# Patient Record
Sex: Male | Born: 1965 | Race: White | Hispanic: No | Marital: Married | Smoking: Never smoker
Health system: Southern US, Community
[De-identification: ages and names within clinical notes are randomized; demographics above are authoritative.]

## PROBLEM LIST (undated history)

## (undated) DIAGNOSIS — J302 Other seasonal allergic rhinitis: Secondary | ICD-10-CM

## (undated) HISTORY — PX: SPLENECTOMY: SUR1306

## (undated) HISTORY — PX: ABDOMINAL SURGERY: SHX537

---

## 2016-03-03 ENCOUNTER — Encounter (INDEPENDENT_AMBULATORY_CARE_PROVIDER_SITE_OTHER): Payer: Self-pay | Admitting: *Deleted

## 2016-03-25 ENCOUNTER — Other Ambulatory Visit (INDEPENDENT_AMBULATORY_CARE_PROVIDER_SITE_OTHER): Payer: Self-pay | Admitting: *Deleted

## 2016-03-25 ENCOUNTER — Encounter (INDEPENDENT_AMBULATORY_CARE_PROVIDER_SITE_OTHER): Payer: Self-pay | Admitting: *Deleted

## 2016-03-25 DIAGNOSIS — Z1211 Encounter for screening for malignant neoplasm of colon: Secondary | ICD-10-CM

## 2016-06-03 ENCOUNTER — Telehealth (INDEPENDENT_AMBULATORY_CARE_PROVIDER_SITE_OTHER): Payer: Self-pay | Admitting: *Deleted

## 2016-06-03 ENCOUNTER — Encounter (INDEPENDENT_AMBULATORY_CARE_PROVIDER_SITE_OTHER): Payer: Self-pay | Admitting: *Deleted

## 2016-06-03 NOTE — Telephone Encounter (Signed)
Patient needs trilyte 

## 2016-06-07 MED ORDER — PEG 3350-KCL-NA BICARB-NACL 420 G PO SOLR: 4000.0000 mL | Freq: Once | ORAL | 0 refills | Status: AC

## 2016-06-20 ENCOUNTER — Telehealth (INDEPENDENT_AMBULATORY_CARE_PROVIDER_SITE_OTHER): Payer: Self-pay | Admitting: *Deleted

## 2016-06-20 NOTE — Telephone Encounter (Signed)
Referring MD/PCP: burdine   Procedure: tcs  Reason/Indication:  screening  Has patient had this procedure before?  no  If so, when, by whom and where?    Is there a family history of colon cancer?  no  Who?  What age when diagnosed?    Is patient diabetic?   no      Does patient have prosthetic heart valve or mechanical valve?  no  Do you have a pacemaker?  no  Has patient ever had endocarditis? no  Has patient had joint replacement within last 12 months?  no  Does patient tend to be constipated or take laxatives? no  Does patient have a history of alcohol/drug use?  no  Is patient on Coumadin, Plavix and/or Aspirin? yes  Medications: asa 81 mg daily  Allergies: nkda  Medication Adjustment: asa 2 days  Procedure date & time: 07/13/16 at 730

## 2016-06-22 NOTE — Telephone Encounter (Signed)
agree

## 2016-07-13 ENCOUNTER — Encounter (HOSPITAL_COMMUNITY): Payer: Self-pay | Admitting: *Deleted

## 2016-07-13 ENCOUNTER — Encounter (HOSPITAL_COMMUNITY)
Admission: RE | Disposition: A | Discharge status: Home / Self Care | Payer: Self-pay | Source: Ambulatory Visit | Attending: Internal Medicine

## 2016-07-13 ENCOUNTER — Ambulatory Visit (HOSPITAL_COMMUNITY)
Admission: RE | Admit: 2016-07-13 | Discharge: 2016-07-13 | Disposition: A | Discharge status: Home / Self Care | Payer: 59 | Source: Ambulatory Visit | Attending: Internal Medicine | Admitting: Internal Medicine

## 2016-07-13 DIAGNOSIS — Z7982 Long term (current) use of aspirin: Secondary | ICD-10-CM | POA: Diagnosis not present

## 2016-07-13 DIAGNOSIS — K648 Other hemorrhoids: Secondary | ICD-10-CM | POA: Insufficient documentation

## 2016-07-13 DIAGNOSIS — Z7951 Long term (current) use of inhaled steroids: Secondary | ICD-10-CM | POA: Diagnosis not present

## 2016-07-13 DIAGNOSIS — J302 Other seasonal allergic rhinitis: Secondary | ICD-10-CM | POA: Insufficient documentation

## 2016-07-13 DIAGNOSIS — Z1211 Encounter for screening for malignant neoplasm of colon: Secondary | ICD-10-CM

## 2016-07-13 HISTORY — DX: Other seasonal allergic rhinitis: J30.2

## 2016-07-13 HISTORY — PX: COLONOSCOPY: SHX5424

## 2016-07-13 SURGERY — COLONOSCOPY
Anesthesia: Moderate Sedation

## 2016-07-13 MED ORDER — MIDAZOLAM HCL 5 MG/5ML IJ SOLN
INTRAMUSCULAR | Status: DC | PRN
  Administered 2016-07-13 (×4): 2 mg via INTRAVENOUS

## 2016-07-13 MED ORDER — SODIUM CHLORIDE 0.9 % IV SOLN
INTRAVENOUS | Status: DC
  Administered 2016-07-13: 08:00:00 via INTRAVENOUS

## 2016-07-13 MED ORDER — MEPERIDINE HCL 50 MG/ML IJ SOLN
INTRAMUSCULAR | Status: DC | PRN
  Administered 2016-07-13 (×2): 25 mg via INTRAVENOUS

## 2016-07-13 MED ORDER — MEPERIDINE HCL 50 MG/ML IJ SOLN
INTRAMUSCULAR | Status: AC
  Filled 2016-07-13: qty 1

## 2016-07-13 MED ORDER — MIDAZOLAM HCL 5 MG/5ML IJ SOLN
INTRAMUSCULAR | Status: AC
  Filled 2016-07-13: qty 10

## 2016-07-13 MED ORDER — SIMETHICONE 40 MG/0.6ML PO SUSP
ORAL | Status: AC
  Filled 2016-07-13: qty 0.6

## 2016-07-13 NOTE — Op Note (Signed)
[facility] Patient Name: Lucas Hill Procedure Date: 07/13/2016 8:46 AM MRN: [id number] Date of Birth: 08/04/1966 Attending MD: Lionel December , MD CSN: [id number] Age: 50 Admit Type: Outpatient Procedure:                Colonoscopy Indications:              Screening for colorectal malignant neoplasm Providers:                Lionel December, MD, Nena Polio, RN, Burke Keels,                            Technician Referring MD:             Juliette Alcide, MD Medicines:                Meperidine 50 mg IV, Midazolam 8 mg IV Complications:            No immediate complications. Estimated Blood Loss:     Estimated blood loss: none. Procedure:                Pre-Anesthesia Assessment:                           - Prior to the procedure, a History and Physical                            was performed, and patient medications and                            allergies were reviewed. The patient's tolerance of                            previous anesthesia was also reviewed. The risks                            and benefits of the procedure and the sedation                            options and risks were discussed with the patient.                            All questions were answered, and informed consent                            was obtained. Prior Anticoagulants: The patient                            last took aspirin 2 days prior to the procedure.                            ASA Grade Assessment: I - A normal, healthy                            patient. After reviewing the risks and benefits,  the patient was deemed in satisfactory condition to                            undergo the procedure.                           After obtaining informed consent, the colonoscope                            was passed under direct vision. Throughout the                            procedure, the patient's blood pressure, pulse, and                            oxygen  saturations were monitored continuously. The                            was introduced through the anus and advanced to the                            the cecum, identified by appendiceal orifice and                            ileocecal valve. The colonoscopy was performed                            without difficulty. The patient tolerated the                            procedure well. The quality of the bowel                            preparation was good. The ileocecal valve,                            appendiceal orifice, and rectum were photographed. Scope In: 9:06:22 AM Scope Out: 9:25:21 AM Scope Withdrawal Time: 0 hours 9 minutes 50 seconds  Total Procedure Duration: 0 hours 18 minutes 59 seconds  Findings:      The colon (entire examined portion) appeared normal.      Internal hemorrhoids were found during retroflexion. The hemorrhoids       were small. Impression:               - The entire examined colon is normal.                           - Internal hemorrhoids.                           - No specimens collected. Moderate Sedation:      Moderate (conscious) sedation was administered by the endoscopy nurse       and supervised by the endoscopist. The following parameters were       monitored: oxygen saturation, heart rate, blood pressure, CO2  capnography and response to care. Total physician intraservice time was       25 minutes. Recommendation:           - Patient has a contact number available for                            emergencies. The signs and symptoms of potential                            delayed complications were discussed with the                            patient. Return to normal activities tomorrow.                            Written discharge instructions were provided to the                            patient.                           - Resume previous diet today.                           - Continue present medications.                            - Repeat colonoscopy in 10 years for screening                            purposes. Procedure Code(s):        --- Professional ---                           [phone], Colonoscopy, flexible; diagnostic, including                            collection of specimen(s) by brushing or washing,                            when performed (separate procedure)                           [id number], Moderate sedation services provided by the                            same physician or other qualified health care                            professional performing the diagnostic or                            therapeutic service that the sedation supports,                            requiring the presence of an independent trained  observer to assist in the monitoring of the                            patient's level of consciousness and physiological                            status; initial 15 minutes of intraservice time,                            patient age 73 years or older                           [phone], Moderate sedation services; each additional                            15 minutes intraservice time Diagnosis Code(s):        --- Professional ---                           Z12.11, Encounter for screening for malignant                            neoplasm of colon                           K64.8, Other hemorrhoids CPT copyright 2016 [employer]. All rights reserved. The codes documented in this report are preliminary and upon coder review may  be revised to meet current compliance requirements. Lionel December, MD Lionel December, MD 07/13/2016 9:32:40 AM This report has been signed electronically. Number of Addenda: 0

## 2016-07-13 NOTE — H&P (Signed)
Lucas CapraSteven Hill is an 50 y.o. male.   Chief Complaint: Patient is here for colonoscopy. HPI: Patient is 50 year old Caucasian male who is in for screening colonoscopy. He denies abdominal pain change in bowel habits or rectal bleeding. Family history is negative for CRC.  Past Medical History:  Diagnosis Date  . Seasonal allergies     Past Surgical History:  Procedure Laterality Date  . 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 SPLENECTOMY      History reviewed. No pertinent family history. Social History:  reports that he has never smoked. He has never used smokeless tobacco. He reports that he does not drink alcohol or use drugs.  Allergies: No Known Allergies  Medications Prior to Admission  Medication Sig Dispense Refill  . aspirin 81 MG chewable tablet Chew 81 mg by mouth daily.    . fluticasone (FLONASE) 50 MCG/ACT nasal spray Place 1 spray into both nostrils daily.      No results found for this or any previous visit (from the past 48 hour(s)). No results found.  ROS  Blood pressure 131/80, pulse (!) 56, temperature 97.6 F (36.4 C), temperature source Oral, resp. rate 16, height 6\' 1"  (1.854 m), weight 209 lb (94.8 kg), SpO2 100 %. Physical Exam  Constitutional: He appears well-developed and well-nourished.  HENT:  Mouth/Throat: Oropharynx is clear and moist.  Eyes: Conjunctivae are normal. No scleral icterus.  Neck: No thyromegaly present.  Cardiovascular: Normal rate, regular rhythm and normal heart sounds.   No murmur heard. Respiratory: Effort normal and breath sounds normal.  GI:  Abdomen is symmetrical with scar left upper quadrant as well as Cottonoid quadrant. Abdomen is soft and non-tender without organomegaly or masses.  Lymphadenopathy:    He has no cervical adenopathy.  Neurological: He is alert.  Skin: Skin is warm and dry.     Assessment/Plan Average risk screening colonoscopy.  Lucas DecemberNajeeb Rehman, MD 07/13/2016, 8:55 AM

## 2016-07-13 NOTE — Discharge Instructions (Signed)
Resume usual medications and diet. ° No driving for 24 hours. ° Next screening exam in 10 years. ° °Colonoscopy, Care After °Refer to this sheet in the next few weeks. These instructions provide you with information on caring for yourself after your procedure. Your health care provider may also give you more specific instructions. Your treatment has been planned according to current medical practices, but problems sometimes occur. Call your health care provider if you have any problems or questions after your procedure. °WHAT TO EXPECT AFTER THE PROCEDURE  °After your procedure, it is typical to have the following: °· A small amount of blood in your stool. °· Moderate amounts of gas and mild abdominal cramping or bloating. °HOME CARE INSTRUCTIONS °· Do not drive, operate machinery, or sign important documents for 24 hours. °· You may shower and resume your regular physical activities, but move at a slower pace for the first 24 hours. °· Take frequent rest periods for the first 24 hours. °· Walk around or put a warm pack on your abdomen to help reduce abdominal cramping and bloating. °· Drink enough fluids to keep your urine clear or pale yellow. °· You may resume your normal diet as instructed by your health care provider. Avoid heavy or fried foods that are hard to digest. °· Avoid drinking alcohol for 24 hours or as instructed by your health care provider. °· Only take over-the-counter or prescription medicines as directed by your health care provider. °· If a tissue sample (biopsy) was taken during your procedure: °¨ Do not take aspirin or blood thinners for 7 days, or as instructed by your health care provider. °¨ Do not drink alcohol for 7 days, or as instructed by your health care provider. °¨ Eat soft foods for the first 24 hours. °SEEK MEDICAL CARE IF: °You have persistent spotting of blood in your stool 2-3 days after the procedure. °SEEK IMMEDIATE MEDICAL CARE IF: °· You have more than a small spotting of  blood in your stool. °· You pass large blood clots in your stool. °· Your abdomen is swollen (distended). °· You have nausea or vomiting. °· You have a fever. °· You have increasing abdominal pain that is not relieved with medicine. °  °This information is not intended to replace advice given to you by your health care provider. Make sure you discuss any questions you have with your health care provider. °  °Document Released: 06/07/2004 Document Revised: 08/14/2013 Document Reviewed: 07/01/2013 °Elsevier Interactive Patient Education ©2016 [employer]. ° °

## 2016-07-18 ENCOUNTER — Encounter (HOSPITAL_COMMUNITY): Payer: Self-pay | Admitting: Internal Medicine

## 2019-08-27 ENCOUNTER — Other Ambulatory Visit: Payer: Self-pay

## 2019-08-27 DIAGNOSIS — Z20822 Contact with and (suspected) exposure to covid-19: Secondary | ICD-10-CM

## 2019-08-29 LAB — NOVEL CORONAVIRUS, NAA: SARS-CoV-2, NAA: NOT DETECTED

## 2020-02-06 ENCOUNTER — Ambulatory Visit: Payer: 59 | Attending: Internal Medicine

## 2020-02-06 DIAGNOSIS — Z23 Encounter for immunization: Secondary | ICD-10-CM

## 2020-02-06 NOTE — Progress Notes (Signed)
   Covid-19 Vaccination Clinic  Name:  Lucas Hill    MRN: [id number] DOB: 04/24/1966  02/06/2020  [name] was observed post Covid-19 immunization for 15 minutes without incident. He was provided with Vaccine Information Sheet and instruction to access the V-Safe system.   [name] was instructed to call 911 with any severe reactions post vaccine: Marland Kitchen Difficulty breathing  . Swelling of face and throat  . A fast heartbeat  . A bad rash all over body  . Dizziness and weakness   Immunizations Administered    Name Date Dose VIS Date Route   Pfizer COVID-19 Vaccine 02/06/2020  9:28 AM 0.3 mL 10/18/2019 Intramuscular   Manufacturer: [employer], [employer]   Lot: [id number]   NDC: [address]-3

## 2020-03-02 ENCOUNTER — Ambulatory Visit: Payer: 59 | Attending: Internal Medicine

## 2020-03-02 DIAGNOSIS — Z23 Encounter for immunization: Secondary | ICD-10-CM

## 2020-03-02 NOTE — Progress Notes (Signed)
   Covid-19 Vaccination Clinic  Name:  Lucas Hill    MRN: [id number] DOB: Dec 25, 1965  03/02/2020  Lucas Hill was observed post Covid-19 immunization for 15 minutes without incident. He was provided with Vaccine Information Sheet and instruction to access the V-Safe system.   Lucas Hill was instructed to call 911 with any severe reactions post vaccine: Marland Kitchen Difficulty breathing  . Swelling of face and throat  . A fast heartbeat  . A bad rash all over body  . Dizziness and weakness   Immunizations Administered    Name Date Dose VIS Date Route   Pfizer COVID-19 Vaccine 03/02/2020  8:56 AM 0.3 mL 01/01/2019 Intramuscular   Manufacturer: [employer], [employer]   Lot: [id number]   NDC: [address]-3

## 2020-11-24 ENCOUNTER — Other Ambulatory Visit: Payer: Self-pay

## 2020-11-24 ENCOUNTER — Ambulatory Visit (INDEPENDENT_AMBULATORY_CARE_PROVIDER_SITE_OTHER): Payer: 59 | Admitting: Allergy

## 2020-11-24 ENCOUNTER — Encounter: Payer: Self-pay | Admitting: Allergy

## 2020-11-24 VITALS — BP 122/82 | HR 67 | Temp 97.2°F | Resp 18 | Ht 72.44 in | Wt 200.0 lb

## 2020-11-24 DIAGNOSIS — J3089 Other allergic rhinitis: Secondary | ICD-10-CM | POA: Diagnosis not present

## 2020-11-24 DIAGNOSIS — H1013 Acute atopic conjunctivitis, bilateral: Secondary | ICD-10-CM | POA: Insufficient documentation

## 2020-11-24 NOTE — Patient Instructions (Addendum)
Today's skin testing showed: Positive to grass, trees, weed, dust mites, mold, cat, dog, cockroach. Borderline to mouse and tobacco.   Environmental allergies  Start environmental control measures as below.  May use over the counter antihistamines such as Zyrtec (cetirizine), Claritin (loratadine), Allegra (fexofenadine), or Xyzal (levocetirizine) daily as needed.  Start using Flonase (fluticasone) nasal spray 1 spray per nostril twice a day daily for the next 2 months.  Start nasal saline lavage (i.e., NeilMed) daily.  Consider allergy injections - handout given.   Had a detailed discussion with patient/family that clinical history is suggestive of allergic rhinitis, and may benefit from allergy immunotherapy (AIT). Discussed in detail regarding the dosing, schedule, side effects (mild to moderate local allergic reaction and rarely systemic allergic reactions including anaphylaxis), and benefits (significant improvement in nasal symptoms, seasonal flares of asthma) of immunotherapy with the patient. There is significant time commitment involved with allergy shots, which includes weekly immunotherapy injections for first 9-12 months and then biweekly to monthly injections for 3-5 years.   Follow up in 2 months or sooner if needed in [place] with Dr. Dellis Anes.   Reducing Pollen Exposure . Pollen seasons: trees (spring), grass (summer) and ragweed/weeds (fall). Marland Kitchen Keep windows closed in your home and car to lower pollen exposure.  Lilian Kapur air conditioning in the bedroom and throughout the house if possible.  . Avoid going out in dry windy days - especially early morning. . Pollen counts are highest between 5 - 10 AM and on dry, hot and windy days.  . Save outside activities for late afternoon or after a heavy rain, when pollen levels are lower.  . Avoid mowing of grass if you have grass pollen allergy. Marland Kitchen Be aware that pollen can also be transported indoors on people and pets.  . Dry  your clothes in an automatic dryer rather than hanging them outside where they might collect pollen.  . Rinse hair and eyes before bedtime. Control of House Dust Mite Allergen . Dust mite allergens are a common trigger of allergy and asthma symptoms. While they can be found throughout the house, these microscopic creatures thrive in warm, humid environments such as bedding, upholstered furniture and carpeting. . Because so much time is spent in the bedroom, it is essential to reduce mite levels there.  . Encase pillows, mattresses, and box springs in special allergen-proof fabric covers or airtight, zippered plastic covers.  . Bedding should be washed weekly in hot water (130 F) and dried in a hot dryer. Allergen-proof covers are available for comforters and pillows that can't be regularly washed.  Reyes Ivan the allergy-proof covers every few months. Minimize clutter in the bedroom. Keep pets out of the bedroom.  Marland Kitchen Keep humidity less than 50% by using a dehumidifier or air conditioning. You can buy a humidity measuring device called a hygrometer to monitor this.  . If possible, replace carpets with hardwood, linoleum, or washable area rugs. If that's not possible, vacuum frequently with a vacuum that has a HEPA filter. . Remove all upholstered furniture and non-washable window drapes from the bedroom. . Remove all non-washable stuffed toys from the bedroom.  Wash stuffed toys weekly.  Pet Allergen Avoidance: . Contrary to popular opinion, there are no "hypoallergenic" breeds of dogs or cats. That is because people are not allergic to an animal's hair, but to an allergen found in the animal's saliva, dander (dead skin flakes) or urine. Pet allergy symptoms typically occur within minutes. For some people, symptoms can  build up and become most severe 8 to 12 hours after contact with the animal. People with severe allergies can experience reactions in public places if dander has been transported on the pet  owners' clothing. Marland Kitchen Keeping an animal outdoors is only a partial solution, since homes with pets in the yard still have higher concentrations of animal allergens. . Before getting a pet, ask your allergist to determine if you are allergic to animals. If your pet is already considered part of your family, try to minimize contact and keep the pet out of the bedroom and other rooms where you spend a great deal of time. . As with dust mites, vacuum carpets often or replace carpet with a hardwood floor, tile or linoleum. . High-efficiency particulate air (HEPA) cleaners can reduce allergen levels over time. . While dander and saliva are the source of cat and dog allergens, urine is the source of allergens from rabbits, hamsters, mice and [place] pigs; so ask a non-allergic family member to clean the animal's cage. . If you have a pet allergy, talk to your allergist about the potential for allergy immunotherapy (allergy shots). This strategy can often provide long-term relief. Mold Control . Mold and fungi can grow on a variety of surfaces provided certain temperature and moisture conditions exist.  . Outdoor molds grow on plants, decaying vegetation and soil. The major outdoor mold, Alternaria and Cladosporium, are found in very high numbers during hot and dry conditions. Generally, a late summer - fall peak is seen for common outdoor fungal spores. Rain will temporarily lower outdoor mold spore count, but counts rise rapidly when the rainy period ends. . The most important indoor molds are Aspergillus and Penicillium. Dark, humid and poorly ventilated basements are ideal sites for mold growth. The next most common sites of mold growth are the bathroom and the kitchen. Outdoor (Seasonal) Mold Control . Use air conditioning and keep windows closed. . Avoid exposure to decaying vegetation. Marland Kitchen Avoid leaf raking. . Avoid grain handling. . Consider wearing a face mask if working in moldy areas.  Indoor  (Perennial) Mold Control  . Maintain humidity below 50%. . Get rid of mold growth on hard surfaces with water, detergent and, if necessary, 5% bleach (do not mix with other cleaners). Then dry the area completely. If mold covers an area more than 10 square feet, consider hiring an indoor environmental professional. . For clothing, washing with soap and water is best. If moldy items cannot be cleaned and dried, throw them away. . Remove sources e.g. contaminated carpets. . Repair and seal leaking roofs or pipes. Using dehumidifiers in damp basements may be helpful, but empty the water and clean units regularly to prevent mildew from forming. All rooms, especially basements, bathrooms and kitchens, require ventilation and cleaning to deter mold and mildew growth. Avoid carpeting on concrete or damp floors, and storing items in damp areas. Cockroach Allergen Avoidance Cockroaches are often found in the homes of densely populated urban areas, schools or commercial buildings, but these creatures can lurk almost anywhere. This does not mean that you have a dirty house or living area. . Block all areas where roaches can enter the home. This includes crevices, wall cracks and windows.  . Cockroaches need water to survive, so fix and seal all leaky faucets and pipes. Have an exterminator go through the house when your family and pets are gone to eliminate any remaining roaches. Marland Kitchen Keep food in lidded containers and put pet food dishes away  after your pets are done eating. Vacuum and sweep the floor after meals, and take out garbage and recyclables. Use lidded garbage containers in the kitchen. Wash dishes immediately after use and clean under stoves, refrigerators or toasters where crumbs can accumulate. Wipe off the stove and other kitchen surfaces and cupboards regularly.  Buffered Isotonic Saline Irrigations:  Goal: . When you irrigate with the isotonic saline (salt water) it washes mucous and other debris  from your nose that could be contributing to your nasal symptoms.   Recipe: Marland Kitchen Obtain 1 quart jar that is clean . Fill with clean (bottled, boiled or distilled) water . Add 1-2 heaping teaspoons of salt without iodine o If the solution with 2 teaspoons of salt is too strong, adjust the amount down until better tolerated . Add 1 teaspoon of [employer] baking soda (pure bicarbonate) . Mix ingredients together and store at room temperature and discard after 1 week * Alternatively you can buy pre made salt packets for the NeilMed bottle or there          are other over the counter brands available  Instructions: . Warm  cup of the solution in the microwave if desired but be careful not to overheat as this will burn the inside of your nose . Stand over a sink (or do it while you shower) and squirt the solution into one side of your nose aiming towards the back of your head o Sometimes saying "coca cola" while irrigating can be helpful to prevent fluid from going down your throat  . The solution will travel to the back of your nose and then come out the other side . Perform this again on the other side . Try to do this twice a day . If you are using a nasal spray in addition to the irrigation, irrigate first and then use the topical nasal spray otherwise you will wash the nasal spray out of your noseZ

## 2020-11-24 NOTE — Progress Notes (Signed)
New Patient Note  RE: Lucas Hill MRN: [id number] DOB: May 14, 1966 Date of Office Visit: 11/24/2020  Referring provider: Juliette Alcide, MD Primary care provider: Juliette Alcide, MD  Chief Complaint: Sinus Problem  History of Present Illness: I had the pleasure of seeing Gordan Grell for initial evaluation at the [facility] on 11/24/2020. He is a 55 y.o. male, who is self-referred here for the evaluation of rhinitis.  He reports symptoms of nasal congestion, rhinorrhea, sneezing, occasional itchy/watery eyes. Symptoms have been going on for 10 years. The symptoms usually occur in Feb/March and possibly winter months. Other triggers include exposure to unknown. Anosmia: diminished sense of smell. Headache: no. He has used Flonase 2 sprays daily with fair improvement in symptoms. Tried Claritin with no benefit. Sinus infections: few times per year requiring antibiotics. Previous work up includes: no. Previous ENT evaluation: no. Previous sinus imaging: no. History of nasal polyps: no. Last eye exam: 1 year ago. History of reflux: sometimes.  Assessment and Plan: Cayde is a 55 y.o. male with: Other allergic rhinitis Mainly rhinitis symptoms for the past 10 years during Feb/March and winter months requiring antibiotics to clear up the symptoms. Tried Flonase with good benefit, Claritin not effective. No prior allergy/ENT evaluation.  Today's skin testing showed: Positive to grass, trees, weed, dust mites, mold, cat, dog, cockroach. Borderline to mouse and tobacco.   On physical exam significant left sided nasal turbinate hypertrophy with questionable polyp - denies symptoms.   Start environmental control measures as below.  May use over the counter antihistamines such as Zyrtec (cetirizine), Claritin (loratadine), Allegra (fexofenadine), or Xyzal (levocetirizine) daily as needed.  Start using Flonase (fluticasone) nasal spray 1 spray per nostril twice a day  daily for the next 2 months.  Start nasal saline lavage (i.e., NeilMed) daily.  If no improvement in physical exam/symptoms with above treatment, will refer to ENT next.   Consider allergy injections for long term control - handout given.   Had a detailed discussion with patient/family that clinical history is suggestive of allergic rhinitis, and may benefit from allergy immunotherapy (AIT). Discussed in detail regarding the dosing, schedule, side effects (mild to moderate local allergic reaction and rarely systemic allergic reactions including anaphylaxis), and benefits (significant improvement in nasal symptoms, seasonal flares of asthma) of immunotherapy with the patient. There is significant time commitment involved with allergy shots, which includes weekly immunotherapy injections for first 9-12 months and then biweekly to monthly injections for 3-5 years.  Allergic conjunctivitis of both eyes Minimal conjunctivitis symptoms.  See assessment and plan as above for allergic rhinitis.   Return in about 2 months (around 01/22/2021).  Other allergy screening: Asthma: no Food allergy: no Medication allergy: no Hymenoptera allergy: no Urticaria: no Eczema:no History of recurrent infections suggestive of immunodeficency: no  Diagnostics: Skin Testing: Environmental allergy panel. Positive to grass, trees, weed, dust mites, mold, cat, dog, cockroach. Borderline to mouse and tobacco.   Results discussed with patient/family.  Airborne Adult Perc - 11/24/20 1528    Time Antigen Placed 1528    Allergen Manufacturer Waynette Buttery    Location Back    Number of Test 59    1. Control-Buffer 50% Glycerol Negative    2. Control-Histamine 1 mg/ml 2+    3. Albumin saline Negative    4. Bahia Negative    5. French Southern Territories Negative    6. Johnson Negative    7. Kentucky Blue 4+    8. Meadow Fescue 3+  9. Perennial Rye 2+    10. Sweet Vernal 3+    11. Timothy 4+    12. Cocklebur Negative    13.  Burweed Marshelder Negative    14. Ragweed, short Negative    15. Ragweed, Giant Negative    16. Plantain,  English Negative    17. Lamb's Quarters Negative    18. Sheep Sorrell Negative    19. Rough Pigweed Negative    20. Marsh Elder, Rough Negative    21. Mugwort, Common Negative    22. Ash mix Negative    23. Birch mix Negative    24. Beech American 2+    25. Box, Elder Negative    26. Cedar, red Negative    27. Cottonwood, Guinea-[place]issau Negative    28. Elm mix Negative    29. Hickory 3+    30. Maple mix 2+    31. Oak, Guinea-[place] mix Negative    32. Pecan Pollen 2+    33. Pine mix Negative    34. Sycamore Guinea-Bissau --   +/-   35. Walnut, Black Pollen --   +/-   36. Alternaria alternata Negative    37. Cladosporium Herbarum Negative    38. Aspergillus mix Negative    39. Penicillium mix Negative    40. Bipolaris sorokiniana (Helminthosporium) Negative    41. Drechslera spicifera (Curvularia) Negative    42. Mucor plumbeus Negative    43. Fusarium moniliforme Negative    44. Aureobasidium pullulans (pullulara) Negative    45. Rhizopus oryzae Negative    46. Botrytis cinera Negative    47. Epicoccum nigrum Negative    48. Phoma betae Negative    49. Candida Albicans Negative    50. Trichophyton mentagrophytes Negative    51. Mite, D Farinae  5,000 AU/ml 2+    52. Mite, D Pteronyssinus  5,000 AU/ml 2+    53. Cat Hair 10,000 BAU/ml 2+    54.  Dog Epithelia Negative    55. Mixed Feathers Negative    56. Horse Epithelia Negative    57. Cockroach, German Negative    58. Mouse --   +/-   59. Tobacco Leaf --   +/-         Intradermal - 11/24/20 1558    Time Antigen Placed 0350    Allergen Manufacturer Waynette Buttery    Location Arm    Number of Test 11    Intradermal Select    Control Negative    [place] 2+    Johnson 2+    Ragweed mix Negative    Weed mix 2+    Mold 1 Negative    Mold 2 3+    Mold 3 2+    Mold 4 3+    Dog 3+    Cockroach 2+           Past Medical  History: Patient Active Problem List   Diagnosis Date Noted  . Other allergic rhinitis 11/24/2020  . Allergic conjunctivitis of both eyes 11/24/2020   Past Medical History:  Diagnosis Date  . Seasonal allergies    Past Surgical History: Past Surgical History:  Procedure Laterality Date  . 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 COLONOSCOPY N/A 07/13/2016   Procedure: COLONOSCOPY;  Surgeon: Malissa Hippo, MD;  Location: [facility];  Service: Endoscopy;  Laterality: N/A;  730  . SPLENECTOMY     Medication List:  Current Outpatient Medications  Medication  Sig Dispense Refill  . aspirin 81 MG chewable tablet Chew 81 mg by mouth daily.    . fluticasone (FLONASE) 50 MCG/ACT nasal spray Place 1 spray into both nostrils daily.     No current facility-administered medications for this visit.   Allergies: No Known Allergies Social History: Social History   Socioeconomic History  . Marital status: Single    Spouse name: Not on file  . Number of children: Not on file  . Years of education: Not on file  . Highest education level: Not on file  Occupational History  . Not on file  Tobacco Use  . Smoking status: Never Smoker  . Smokeless tobacco: Never Used  Substance and Sexual Activity  . Alcohol use: No  . Drug use: No  . Sexual activity: Not on file  Other Topics Concern  . Not on file  Social History Narrative  . Not on file   Social Determinants of Health   Financial Resource Strain: Not on file  Food Insecurity: Not on file  Transportation Needs: Not on file  Physical Activity: Not on file  Stress: Not on file  Social Connections: Not on file   Lives in a 55 year old house. 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 No pertinent family history. Problem                               Relation Asthma                                   No  Eczema                                No  Food allergy                          No  Allergic rhino conjunctivitis     No   Review of Systems  Constitutional: Negative for appetite change, chills, fever and unexpected weight change.  HENT: Negative for congestion and rhinorrhea.   Eyes: Negative for itching.  Respiratory: Negative for cough, chest tightness, shortness of breath and wheezing.   Cardiovascular: Negative for chest pain.  Gastrointestinal: Negative for abdominal pain.  Genitourinary: Negative for difficulty urinating.  Skin: Negative for rash.  Allergic/Immunologic: Positive for environmental allergies.  Neurological: Negative for headaches.   Objective: BP 122/82 (BP Location: Right Arm, Patient Position: Sitting, Cuff Size: Normal)   Pulse 67   Temp (!) 97.2 F (36.2 C) (Temporal)   Resp 18   Ht 6' 0.44" (1.84 m)   Wt 200 lb (90.7 kg)   SpO2 97%   BMI 26.80 kg/m  Body mass index is 26.8 kg/m. Physical Exam Vitals and nursing note reviewed.  Constitutional:      Appearance: Normal appearance. He is well-developed.  HENT:     Head: Normocephalic and atraumatic.     Right Ear: Tympanic membrane and external ear normal.     Left Ear: Tympanic membrane and external ear normal.     Nose: Congestion (left side nasal turbinate  hypertrophy, ? polyp) present.     Mouth/Throat:     Mouth: Mucous membranes are moist.     Pharynx: Oropharynx is clear.  Eyes:     Conjunctiva/sclera: Conjunctivae normal.  Cardiovascular:     Rate and Rhythm: Normal rate and regular rhythm.     Heart sounds: Normal heart sounds. No murmur heard. No friction rub. No gallop.   Pulmonary:     Effort: Pulmonary effort is normal.     Breath sounds: Normal breath sounds. No wheezing, rhonchi or rales.  Musculoskeletal:     Cervical back: Neck supple.   Skin:    General: Skin is warm.     Findings: No rash.  Neurological:     Mental Status: He is alert and oriented to person, place, and time.  Psychiatric:        Behavior: Behavior normal.    The plan was reviewed with the patient/family, and all questions/concerned were addressed.  It was my pleasure to see Mackinley today and participate in his care. Please feel free to contact me with any questions or concerns.  Sincerely,  Wyline Mood, DO Allergy & Immunology  [facility] office: [phone] [facility] office: [phone]

## 2020-11-24 NOTE — Assessment & Plan Note (Signed)
Minimal conjunctivitis symptoms.  See assessment and plan as above for allergic rhinitis.

## 2020-11-24 NOTE — Assessment & Plan Note (Addendum)
Mainly rhinitis symptoms for the past 10 years during Feb/March and winter months requiring antibiotics to clear up the symptoms. Tried Flonase with good benefit, Claritin not effective. No prior allergy/ENT evaluation.  Today's skin testing showed: Positive to grass, trees, weed, dust mites, mold, cat, dog, cockroach. Borderline to mouse and tobacco.   On physical exam significant left sided nasal turbinate hypertrophy with questionable polyp - denies symptoms.   Start environmental control measures as below.  May use over the counter antihistamines such as Zyrtec (cetirizine), Claritin (loratadine), Allegra (fexofenadine), or Xyzal (levocetirizine) daily as needed.  Start using Flonase (fluticasone) nasal spray 1 spray per nostril twice a day daily for the next 2 months.  Start nasal saline lavage (i.e., NeilMed) daily.  If no improvement in physical exam/symptoms with above treatment, will refer to ENT next.   Consider allergy injections for long term control - handout given.   Had a detailed discussion with patient/family that clinical history is suggestive of allergic rhinitis, and may benefit from allergy immunotherapy (AIT). Discussed in detail regarding the dosing, schedule, side effects (mild to moderate local allergic reaction and rarely systemic allergic reactions including anaphylaxis), and benefits (significant improvement in nasal symptoms, seasonal flares of asthma) of immunotherapy with the patient. There is significant time commitment involved with allergy shots, which includes weekly immunotherapy injections for first 9-12 months and then biweekly to monthly injections for 3-5 years.

## 2021-01-27 ENCOUNTER — Ambulatory Visit (INDEPENDENT_AMBULATORY_CARE_PROVIDER_SITE_OTHER): Payer: 59 | Admitting: Allergy & Immunology

## 2021-01-27 ENCOUNTER — Other Ambulatory Visit: Payer: Self-pay

## 2021-01-27 ENCOUNTER — Encounter: Payer: Self-pay | Admitting: Allergy & Immunology

## 2021-01-27 VITALS — BP 140/80 | HR 59 | Temp 98.3°F | Resp 14 | Ht 72.5 in | Wt 206.2 lb

## 2021-01-27 DIAGNOSIS — J3089 Other allergic rhinitis: Secondary | ICD-10-CM | POA: Diagnosis not present

## 2021-01-27 DIAGNOSIS — J302 Other seasonal allergic rhinitis: Secondary | ICD-10-CM | POA: Diagnosis not present

## 2021-01-27 NOTE — Patient Instructions (Addendum)
1. Seasonal and perennial allergic rhinitis - with possible left sided nasal polyp - Continue with Flonase two sprays per nostril up to twice daily. - Continue with an over the counter antihistamine (you can double this dose during bad days). - Continue with nasal saline rinses.  - Allergy shot info and consent signed today. - We will not mix it until you call us to let us know that it is ok. - Allergy shots "re-train" and "reset" the immune system to ignore environmental allergens and decrease the resulting immune response to those allergens (sneezing, itchy watery eyes, runny nose, nasal congestion, etc).    - Allergy shots improve symptoms in 75-85% of patients.  - This would decrease your need for medications and decrease your lifetime exposure to prednisone and antibiotics.   2. Return in about 6 months (around 07/30/2021).    Please inform us of any Emergency Department visits, hospitalizations, or changes in symptoms. Call us before going to the ED for breathing or allergy symptoms since we might be able to fit you in for a sick visit. Feel free to contact us anytime with any questions, problems, or concerns.  It was a pleasure to meet you today!  Websites that have reliable patient information: 1. American Academy of Asthma, Allergy, and Immunology: www.aaaai.org 2. Food Allergy Research and Education (FARE): foodallergy.org 3. Mothers of Asthmatics: http://www.asthmacommunitynetwork.org 4. American College of Allergy, Asthma, and Immunology: www.acaai.org   COVID-19 Vaccine Information can be found at: PodExchange.[email or web address] For questions related to vaccine distribution or appointments, please email [email or web address] or call [phone].   We realize that you might be concerned about having an allergic reaction to the COVID19 vaccines. To help with that concern, WE ARE OFFERING THE COVID19 VACCINES IN OUR OFFICE! Ask the  front desk for dates!     "Like" [place] on Facebook and Instagram for our latest updates!      A healthy democracy works best when [employer] participate! Make sure you are registered to vote! If you have moved or changed any of your contact information, you will need to get this updated before voting!  In some cases, you MAY be able to register to vote online: [email or web address]

## 2021-01-27 NOTE — Progress Notes (Signed)
FOLLOW UP  Date of Service/Encounter:  01/27/21   Assessment:   Perennial and seasonal allergic rhinitis (grasses, trees, weeds, molds, dust mite, dogs, cat, cockroach)   ? Left sided nasal polyp  Plan/Recommendations:   1. Seasonal and perennial allergic rhinitis - with possible left sided nasal polyp - Continue with Flonase two sprays per nostril up to twice daily. - Continue with an over the counter antihistamine (you can double this dose during bad days). - Continue with nasal saline rinses.  - Allergy shot info and consent signed today. - We will not mix it until you call us to let us know that it is ok. - Allergy shots "re-train" and "reset" the immune system to ignore environmental allergens and decrease the resulting immune response to those allergens (sneezing, itchy watery eyes, runny nose, nasal congestion, etc).    - Allergy shots improve symptoms in 75-85% of patients.  - This would decrease your need for medications and decrease your lifetime exposure to prednisone and antibiotics.   2. Return in about 6 months (around 07/30/2021).   Subjective:   Lucas Hill is a 55 y.o. male presenting today for follow up of  Chief Complaint  Patient presents with  . Allergic Rhinitis     Patient states that his allergies are better thus far. Regimen seems to be helpful.    Lucas Hill has a history of the following: Patient Active Problem List   Diagnosis Date Noted  . Other allergic rhinitis 11/24/2020  . Allergic conjunctivitis of both eyes 11/24/2020    History obtained from: chart review and patient.  Lucas Hill is a 55 y.o. male presenting for a follow up visit.  He was last seen in January 2022 by Dr. Selena Batten.  At that time, testing was positive to grasses, trees, weeds, dust mite, mold, cat, dog, and cockroach with borderline reactions to metals and tobacco.  Environmental measures were provided.  He was started on an over-the-counter antihistamine.  He was also  started on Flonase 1 spray per nostril twice daily as well as nasal saline.  Since last visit, he has done very well.   He has sinusitis flares around 2-3 times per year. He gets antibiotics sometimes and prednisone other times. He does not get both of them together. His last round of antibiotics was December. Around two weeks ago, he had a flare and he received prednisone. He did not antibiotics. Overall he feels that his symptoms are well controlled.   He does not think that he needs allergy shots at this point in time, but he is willing to talk to his insurance company about coverage. He would like to avoid systemic steroids and antibiotics in the future and is allergen immunotherapy helps him to do that, he is willing to give it a shot.   Otherwise, there have been no changes to his past medical history, surgical history, family history, or social history.    Review of Systems  Constitutional: Negative.  Negative for chills, fever, malaise/fatigue and weight loss.  HENT: Positive for congestion. Negative for ear discharge, ear pain and sinus pain.   Eyes: Negative for pain, discharge and redness.  Respiratory: Negative for cough, sputum production, shortness of breath and wheezing.   Cardiovascular: Negative.  Negative for chest pain and palpitations.  Gastrointestinal: Negative for abdominal pain, constipation, diarrhea, heartburn, nausea and vomiting.  Skin: Negative.  Negative for itching and rash.  Neurological: Negative for dizziness and headaches.  Endo/Heme/Allergies: Positive for environmental allergies. Does  not bruise/bleed easily.       Objective:   Blood pressure 140/80, pulse (!) 59, temperature 98.3 F (36.8 C), resp. rate 14, height 6' 0.5" (1.842 m), weight 206 lb 3.2 oz (93.5 kg), SpO2 96 %. Body mass index is 27.58 kg/m.   Physical Exam:  Physical Exam Constitutional:      Appearance: He is well-developed.     Comments: Well spoken and very friendly.    HENT:     Head: Normocephalic and atraumatic.     Right Ear: Tympanic membrane, ear canal and external ear normal.     Left Ear: Tympanic membrane, ear canal and external ear normal.     Nose: No nasal deformity, septal deviation, mucosal edema or rhinorrhea.     Right Turbinates: Enlarged, swollen and pale.     Left Turbinates: Enlarged, swollen and pale.     Right Sinus: No maxillary sinus tenderness or frontal sinus tenderness.     Left Sinus: No maxillary sinus tenderness or frontal sinus tenderness.     Comments: There is a possible right sided nasal polyp, although as with previous examinations this is difficult to see.    Mouth/Throat:     Mouth: Mucous membranes are not pale and not dry.     Pharynx: Uvula midline.  Eyes:     General:        Right eye: No discharge.        Left eye: No discharge.     Conjunctiva/sclera: Conjunctivae normal.     Right eye: Right conjunctiva is not injected. No chemosis.    Left eye: Left conjunctiva is not injected. No chemosis.    Pupils: Pupils are equal, round, and reactive to light.  Cardiovascular:     Rate and Rhythm: Normal rate and regular rhythm.     Heart sounds: Normal heart sounds.  Pulmonary:     Effort: Pulmonary effort is normal. No tachypnea, accessory muscle usage or respiratory distress.     Breath sounds: Normal breath sounds. No wheezing, rhonchi or rales.  Chest:     Chest wall: No tenderness.  Lymphadenopathy:     Cervical: No cervical adenopathy.  Skin:    Coloration: Skin is not pale.     Findings: No abrasion, erythema, petechiae or rash. Rash is not papular, urticarial or vesicular.  Neurological:     Mental Status: He is alert.  Psychiatric:        Behavior: Behavior is cooperative.      Diagnostic studies: none       Malachi Bonds, MD  [facility]

## 2021-02-24 ENCOUNTER — Other Ambulatory Visit: Payer: Self-pay | Admitting: Allergy

## 2021-02-24 DIAGNOSIS — J302 Other seasonal allergic rhinitis: Secondary | ICD-10-CM

## 2021-02-24 DIAGNOSIS — J3089 Other allergic rhinitis: Secondary | ICD-10-CM

## 2021-02-25 DIAGNOSIS — J3089 Other allergic rhinitis: Secondary | ICD-10-CM | POA: Diagnosis not present

## 2021-02-25 NOTE — Progress Notes (Signed)
VIALS EXP 02-25-22 

## 2021-02-25 NOTE — Progress Notes (Signed)
Aeroallergen Immunotherapy    Patient Details  Name: Lucas Hill  MRN: [id number]  Date of Birth: Oct 11, 1966   Order 1 of 2   Vial Label: G-W-T-Dm   0.3 ml (Volume) BAU Concentration -- 7 Grass Mix* 100,000 ([address] [place], [place], [place], [place] Rye, RedTop, Sweet Vernal, Timothy)  0.3 ml (Volume) BAU Concentration -- French Southern Territories 10,000  0.2 ml (Volume) 1:20 Concentration -- Johnson  0.5 ml (Volume) 1:20 Concentration -- Weed Mix*  0.5 ml (Volume) 1:20 Concentration -- Eastern 10 Tree Mix (also Sweet Gum)  0.2 ml (Volume) 1:10 Concentration -- Pecan Pollen  0.2 ml (Volume) 1:20 Concentration -- Walnut, Black Pollen  0.5 ml (Volume)  AU Concentration -- Mite Mix (DF 5,000 & DP 5,000)    2.7 ml Extract Subtotal  2.3 ml Diluent  5.0 ml Maintenance Total    Final Concentration above is stated in weight/volume (wt/vol). Allergen units (AU/ml) biological units (BAU/ml). The total volume is 5 ml.    Schedule: B   Special Instructions: once per week

## 2021-02-25 NOTE — Progress Notes (Signed)
Aeroallergen Immunotherapy    Patient Details  Name: Lucas Hill  MRN: [id number]  Date of Birth: 1966-08-12   Order 2 of 2   Vial Label: M-C-D-Cr   0.2 ml (Volume) 1:10 Concentration -- Aspergillus mix  0.2 ml (Volume) 1:10 Concentration -- Penicillium mix  0.2 ml (Volume) 1:20 Concentration -- Bipolaris sorokiniana  0.2 ml (Volume) 1:20 Concentration -- Drechslera spicifera  0.2 ml (Volume) 1:10 Concentration -- Mucor plumbeus  0.2 ml (Volume) 1:10 Concentration -- Fusarium moniliforme  0.2 ml (Volume) 1:40 Concentration -- Aureobasidium pullulans  0.2 ml (Volume) 1:10 Concentration -- Rhizopus oryzae  0.5 ml (Volume) 1:10 Concentration -- Cat Hair  0.3 ml (Volume) 1:20 Concentration -- Cockroach, German  0.5 ml (Volume) 1:10 Concentration -- Dog Epithelia    2.9 ml Extract Subtotal  2.1 ml Diluent  5.0 ml Maintenance Total    Final Concentration above is stated in weight/volume (wt/vol). Allergen units (AU/ml) biological units (BAU/ml). The total volume is 5 ml.    Schedule: B   Special Instructions: once per week

## 2021-02-26 DIAGNOSIS — J3081 Allergic rhinitis due to animal (cat) (dog) hair and dander: Secondary | ICD-10-CM

## 2021-03-19 ENCOUNTER — Other Ambulatory Visit: Payer: Self-pay

## 2021-03-19 ENCOUNTER — Ambulatory Visit (INDEPENDENT_AMBULATORY_CARE_PROVIDER_SITE_OTHER): Payer: 59

## 2021-03-19 DIAGNOSIS — J309 Allergic rhinitis, unspecified: Secondary | ICD-10-CM | POA: Diagnosis not present

## 2021-03-19 MED ORDER — EPINEPHRINE 0.3 MG/0.3ML IJ SOAJ: 0.3000 mg | Freq: Once | INTRAMUSCULAR | 1 refills | Status: AC

## 2021-03-19 NOTE — Progress Notes (Signed)
Immunotherapy   Patient Details  Name: Lucas Hill MRN: [id number] Date of Birth: Feb 28, 1966  03/19/2021  [name] started allergy injections today blue vials waited in the office for 30 minutes without any reactions. Following schedule: B Frequency:Once weekly Epi-Pen:Ordered  Consent signed and patient instructions given.   Florence Canner 03/19/2021, 4:31 PM

## 2021-03-19 NOTE — Addendum Note (Signed)
Addended by: Florence Canner on: 03/19/2021 04:37 PM   Modules accepted: Orders

## 2021-04-14 ENCOUNTER — Ambulatory Visit (INDEPENDENT_AMBULATORY_CARE_PROVIDER_SITE_OTHER): Payer: 59

## 2021-04-14 DIAGNOSIS — J309 Allergic rhinitis, unspecified: Secondary | ICD-10-CM | POA: Diagnosis not present

## 2021-04-21 ENCOUNTER — Ambulatory Visit (INDEPENDENT_AMBULATORY_CARE_PROVIDER_SITE_OTHER): Payer: 59

## 2021-04-21 DIAGNOSIS — J309 Allergic rhinitis, unspecified: Secondary | ICD-10-CM | POA: Diagnosis not present

## 2021-04-28 ENCOUNTER — Ambulatory Visit (INDEPENDENT_AMBULATORY_CARE_PROVIDER_SITE_OTHER): Payer: 59

## 2021-04-28 DIAGNOSIS — J309 Allergic rhinitis, unspecified: Secondary | ICD-10-CM

## 2021-05-05 ENCOUNTER — Ambulatory Visit (INDEPENDENT_AMBULATORY_CARE_PROVIDER_SITE_OTHER): Payer: 59

## 2021-05-05 DIAGNOSIS — J309 Allergic rhinitis, unspecified: Secondary | ICD-10-CM

## 2021-05-12 ENCOUNTER — Ambulatory Visit (INDEPENDENT_AMBULATORY_CARE_PROVIDER_SITE_OTHER): Payer: 59

## 2021-05-12 DIAGNOSIS — J309 Allergic rhinitis, unspecified: Secondary | ICD-10-CM

## 2021-05-19 ENCOUNTER — Ambulatory Visit (INDEPENDENT_AMBULATORY_CARE_PROVIDER_SITE_OTHER): Payer: 59

## 2021-05-19 DIAGNOSIS — J309 Allergic rhinitis, unspecified: Secondary | ICD-10-CM | POA: Diagnosis not present

## 2021-05-28 ENCOUNTER — Ambulatory Visit (INDEPENDENT_AMBULATORY_CARE_PROVIDER_SITE_OTHER): Payer: 59

## 2021-05-28 DIAGNOSIS — J309 Allergic rhinitis, unspecified: Secondary | ICD-10-CM

## 2021-06-04 ENCOUNTER — Ambulatory Visit (INDEPENDENT_AMBULATORY_CARE_PROVIDER_SITE_OTHER): Payer: 59

## 2021-06-04 DIAGNOSIS — J309 Allergic rhinitis, unspecified: Secondary | ICD-10-CM | POA: Diagnosis not present

## 2021-06-16 ENCOUNTER — Ambulatory Visit (INDEPENDENT_AMBULATORY_CARE_PROVIDER_SITE_OTHER): Payer: 59 | Admitting: *Deleted

## 2021-06-16 DIAGNOSIS — J309 Allergic rhinitis, unspecified: Secondary | ICD-10-CM | POA: Diagnosis not present

## 2021-06-23 ENCOUNTER — Ambulatory Visit (INDEPENDENT_AMBULATORY_CARE_PROVIDER_SITE_OTHER): Payer: 59

## 2021-06-23 DIAGNOSIS — J309 Allergic rhinitis, unspecified: Secondary | ICD-10-CM

## 2021-07-02 ENCOUNTER — Ambulatory Visit (INDEPENDENT_AMBULATORY_CARE_PROVIDER_SITE_OTHER): Payer: 59

## 2021-07-02 DIAGNOSIS — J309 Allergic rhinitis, unspecified: Secondary | ICD-10-CM | POA: Diagnosis not present

## 2021-07-07 ENCOUNTER — Ambulatory Visit (INDEPENDENT_AMBULATORY_CARE_PROVIDER_SITE_OTHER): Payer: 59

## 2021-07-07 DIAGNOSIS — J309 Allergic rhinitis, unspecified: Secondary | ICD-10-CM

## 2021-07-14 ENCOUNTER — Ambulatory Visit (INDEPENDENT_AMBULATORY_CARE_PROVIDER_SITE_OTHER): Payer: 59

## 2021-07-14 DIAGNOSIS — J309 Allergic rhinitis, unspecified: Secondary | ICD-10-CM

## 2021-07-21 ENCOUNTER — Ambulatory Visit (INDEPENDENT_AMBULATORY_CARE_PROVIDER_SITE_OTHER): Payer: 59 | Admitting: *Deleted

## 2021-07-21 DIAGNOSIS — J309 Allergic rhinitis, unspecified: Secondary | ICD-10-CM | POA: Diagnosis not present

## 2021-07-28 ENCOUNTER — Ambulatory Visit (INDEPENDENT_AMBULATORY_CARE_PROVIDER_SITE_OTHER): Payer: 59

## 2021-07-28 DIAGNOSIS — J309 Allergic rhinitis, unspecified: Secondary | ICD-10-CM | POA: Diagnosis not present

## 2021-08-04 ENCOUNTER — Ambulatory Visit (INDEPENDENT_AMBULATORY_CARE_PROVIDER_SITE_OTHER): Payer: 59

## 2021-08-04 DIAGNOSIS — J309 Allergic rhinitis, unspecified: Secondary | ICD-10-CM

## 2021-08-06 ENCOUNTER — Ambulatory Visit (INDEPENDENT_AMBULATORY_CARE_PROVIDER_SITE_OTHER): Payer: 59 | Admitting: Allergy & Immunology

## 2021-08-06 ENCOUNTER — Other Ambulatory Visit: Payer: Self-pay

## 2021-08-06 ENCOUNTER — Encounter: Payer: Self-pay | Admitting: Allergy & Immunology

## 2021-08-06 VITALS — BP 122/70 | HR 63 | Temp 98.3°F | Resp 18 | Ht 74.0 in | Wt 214.4 lb

## 2021-08-06 DIAGNOSIS — J330 Polyp of nasal cavity: Secondary | ICD-10-CM | POA: Diagnosis not present

## 2021-08-06 DIAGNOSIS — J302 Other seasonal allergic rhinitis: Secondary | ICD-10-CM

## 2021-08-06 DIAGNOSIS — J3089 Other allergic rhinitis: Secondary | ICD-10-CM

## 2021-08-06 MED ORDER — XHANCE 93 MCG/ACT NA EXHU: 1.0000 "application " | INHALANT_SUSPENSION | Freq: Two times a day (BID) | NASAL | 3 refills | Status: DC

## 2021-08-06 NOTE — Patient Instructions (Addendum)
1. Seasonal and perennial allergic rhinitis - with bilateral nasal polyps (MUCH worse on the left side)  - Stop the Flonase and start Xhance one spray per nostril twice daily (this will get into your nasal cavity much more effectively than the Flonase). - We may consider Dupixent in the future if the [place] does not work well enough. - Be sure to take Claritin on shot days. - Continue with allergy shots at the same schedule.   2. Return in about 3 months (around 11/05/2021).    Please inform us of any Emergency Department visits, hospitalizations, or changes in symptoms. Call us before going to the ED for breathing or allergy symptoms since we might be able to fit you in for a sick visit. Feel free to contact us anytime with any questions, problems, or concerns.  It was a pleasure to see you again today!  Websites that have reliable patient information: 1. American Academy of Asthma, Allergy, and Immunology: www.aaaai.org 2. Food Allergy Research and Education (FARE): foodallergy.org 3. Mothers of Asthmatics: http://www.asthmacommunitynetwork.org 4. American College of Allergy, Asthma, and Immunology: www.acaai.org   COVID-19 Vaccine Information can be found at: [email or web address] For questions related to vaccine distribution or appointments, please email [email or web address] or call [phone].   We realize that you might be concerned about having an allergic reaction to the COVID19 vaccines. To help with that concern, WE ARE OFFERING THE COVID19 VACCINES IN OUR OFFICE! Ask the front desk for dates!     "Like" [place] on Facebook and Instagram for our latest updates!      A healthy democracy works best when [employer] participate! Make sure you are registered to vote! If you have moved or changed any of your contact information, you will need to get this updated before voting!  In some cases, you MAY be able to register to  vote online: [email or web address]

## 2021-08-06 NOTE — Progress Notes (Signed)
FOLLOW UP  Date of Service/Encounter:  08/06/21   Assessment:    Perennial and seasonal allergic rhinitis (grasses, trees, weeds, molds, dust mite, dogs, cat, cockroach)    Bilateral nasal polyps - worse on the left (starting Xhance today and not interested in surgery)  Plan/Recommendations:   1. Seasonal and perennial allergic rhinitis - with bilateral nasal polyps (MUCH worse on the left side)  - Stop the Flonase and start Xhance one spray per nostril twice daily (this will get into your nasal cavity much more effectively than the Flonase). - We may consider Dupixent in the future if the [place] does not work well enough. - Be sure to take Claritin on shot days. - Continue with allergy shots at the same schedule.   2. Return in about 3 months (around 11/05/2021).    Subjective:   Lucas Hill is a 55 y.o. male presenting today for follow up of  Chief Complaint  Patient presents with   Allergic Rhinitis     Lucas Hill has a history of the following: Patient Active Problem List   Diagnosis Date Noted   Other allergic rhinitis 11/24/2020   Allergic conjunctivitis of both eyes 11/24/2020    History obtained from: chart review and patient.  Lucas Hill is a 55 y.o. male presenting for a follow up visit.  He was last seen in March 2022.  At that time, we continued with Flonase 2 sprays per nostril up to twice daily and an over-the-counter antihistamine.  Did decide to start allergy immunotherapy.  Since the last visit, he has done well. He did have one episode where he had a lot of swelling. He did have swelling at one site once. No systmeic symptoms. He does not premedicate with an antihistamine prior to his shots.   He did just finish medication for a sinus infection. He normally has 3-4 sinus infections per year. He had one in August, March, January, and September. Some of these were before he started seeing [place], so he is unsure if the shots are helping to decrease these  sinus infections. He gets prednisone for his sinus infections every time, usually around 3-4 times per year.   He is wondering whether he needs to see ENT. He has never seen ENT, but it should be noted that he is not interested in surgery for his congestion. Polyps are much more evident today, likely due to decrease in mucous this visit. He has only ever been on fluticasone, mometasone and triamcinolone for his nasal sprays.   Otherwise, there have been no changes to his past medical history, surgical history, family history, or social history.    Review of Systems  Constitutional: Negative.  Negative for chills, fever, malaise/fatigue and weight loss.  HENT:  Positive for congestion. Negative for ear discharge and ear pain.   Eyes:  Negative for pain, discharge and redness.  Respiratory:  Negative for cough, sputum production, shortness of breath and wheezing.   Cardiovascular: Negative.  Negative for chest pain and palpitations.  Gastrointestinal:  Negative for abdominal pain, constipation, diarrhea, heartburn, nausea and vomiting.  Skin: Negative.  Negative for itching and rash.  Neurological:  Negative for dizziness and headaches.  Endo/Heme/Allergies:  Negative for environmental allergies. Does not bruise/bleed easily.      Objective:   Blood pressure 122/70, pulse 63, temperature 98.3 F (36.8 C), temperature source Temporal, resp. rate 18, height 6\' 2"  (1.88 m), weight 214 lb 6.4 oz (97.3 kg), SpO2 96 %. Body mass index  is 27.53 kg/m.   Physical Exam:  Physical Exam Vitals reviewed.  Constitutional:      Appearance: He is well-developed.     Comments: Very friendly. Cooperative with the exam.   HENT:     Head: Normocephalic and atraumatic.     Right Ear: Tympanic membrane, ear canal and external ear normal.     Left Ear: Tympanic membrane, ear canal and external ear normal.     Nose: No nasal deformity, septal deviation, mucosal edema or rhinorrhea.     Right  Turbinates: Enlarged and swollen.     Left Turbinates: Enlarged and swollen.     Right Sinus: No maxillary sinus tenderness or frontal sinus tenderness.     Left Sinus: No maxillary sinus tenderness or frontal sinus tenderness.     Comments: No epistaxis noted. Bilateral nasal polyps noted today. Left is approximately 100% obstructed. Right is approximately 50% obstructed.     Mouth/Throat:     Mouth: Mucous membranes are not pale and not dry.     Pharynx: Uvula midline.  Eyes:     General: Lids are normal. No allergic shiner.       Right eye: No discharge.        Left eye: No discharge.     Conjunctiva/sclera: Conjunctivae normal.     Right eye: Right conjunctiva is not injected. No chemosis.    Left eye: Left conjunctiva is not injected. No chemosis.    Pupils: Pupils are equal, round, and reactive to light.  Cardiovascular:     Rate and Rhythm: Normal rate and regular rhythm.     Heart sounds: Normal heart sounds.  Pulmonary:     Effort: Pulmonary effort is normal. No tachypnea, accessory muscle usage or respiratory distress.     Breath sounds: Normal breath sounds. No wheezing, rhonchi or rales.     Comments: Moving air well in all lung fields. No increased work of breathing noted.  Chest:     Chest wall: No tenderness.  Lymphadenopathy:     Cervical: No cervical adenopathy.  Skin:    Coloration: Skin is not pale.     Findings: No abrasion, erythema, petechiae or rash. Rash is not papular, urticarial or vesicular.  Neurological:     Mental Status: He is alert.  Psychiatric:        Behavior: Behavior is cooperative.     Diagnostic studies: none     Malachi Bonds, MD  [facility]

## 2021-08-08 ENCOUNTER — Encounter: Payer: Self-pay | Admitting: Allergy & Immunology

## 2021-08-11 ENCOUNTER — Ambulatory Visit (INDEPENDENT_AMBULATORY_CARE_PROVIDER_SITE_OTHER): Payer: 59

## 2021-08-11 DIAGNOSIS — J309 Allergic rhinitis, unspecified: Secondary | ICD-10-CM | POA: Diagnosis not present

## 2021-08-20 ENCOUNTER — Ambulatory Visit (INDEPENDENT_AMBULATORY_CARE_PROVIDER_SITE_OTHER): Payer: 59

## 2021-08-20 DIAGNOSIS — J309 Allergic rhinitis, unspecified: Secondary | ICD-10-CM | POA: Diagnosis not present

## 2021-09-03 ENCOUNTER — Ambulatory Visit (INDEPENDENT_AMBULATORY_CARE_PROVIDER_SITE_OTHER): Payer: 59

## 2021-09-03 DIAGNOSIS — J309 Allergic rhinitis, unspecified: Secondary | ICD-10-CM | POA: Diagnosis not present

## 2021-09-08 ENCOUNTER — Ambulatory Visit (INDEPENDENT_AMBULATORY_CARE_PROVIDER_SITE_OTHER): Payer: 59

## 2021-09-08 DIAGNOSIS — J309 Allergic rhinitis, unspecified: Secondary | ICD-10-CM | POA: Diagnosis not present

## 2021-09-22 ENCOUNTER — Ambulatory Visit (INDEPENDENT_AMBULATORY_CARE_PROVIDER_SITE_OTHER): Payer: 59

## 2021-09-22 DIAGNOSIS — J309 Allergic rhinitis, unspecified: Secondary | ICD-10-CM | POA: Diagnosis not present

## 2021-09-29 ENCOUNTER — Ambulatory Visit (INDEPENDENT_AMBULATORY_CARE_PROVIDER_SITE_OTHER): Payer: 59 | Admitting: *Deleted

## 2021-09-29 DIAGNOSIS — J309 Allergic rhinitis, unspecified: Secondary | ICD-10-CM | POA: Diagnosis not present

## 2021-10-06 ENCOUNTER — Ambulatory Visit (INDEPENDENT_AMBULATORY_CARE_PROVIDER_SITE_OTHER): Payer: 59

## 2021-10-06 DIAGNOSIS — J309 Allergic rhinitis, unspecified: Secondary | ICD-10-CM

## 2021-10-13 ENCOUNTER — Ambulatory Visit (INDEPENDENT_AMBULATORY_CARE_PROVIDER_SITE_OTHER): Payer: 59

## 2021-10-13 DIAGNOSIS — J309 Allergic rhinitis, unspecified: Secondary | ICD-10-CM

## 2021-10-27 ENCOUNTER — Ambulatory Visit (INDEPENDENT_AMBULATORY_CARE_PROVIDER_SITE_OTHER): Payer: 59

## 2021-10-27 DIAGNOSIS — J309 Allergic rhinitis, unspecified: Secondary | ICD-10-CM

## 2021-11-03 ENCOUNTER — Ambulatory Visit (INDEPENDENT_AMBULATORY_CARE_PROVIDER_SITE_OTHER): Payer: 59 | Admitting: *Deleted

## 2021-11-03 DIAGNOSIS — J309 Allergic rhinitis, unspecified: Secondary | ICD-10-CM

## 2021-11-10 ENCOUNTER — Other Ambulatory Visit: Payer: Self-pay

## 2021-11-10 ENCOUNTER — Telehealth: Payer: Self-pay | Admitting: *Deleted

## 2021-11-10 ENCOUNTER — Ambulatory Visit (INDEPENDENT_AMBULATORY_CARE_PROVIDER_SITE_OTHER): Payer: 59 | Admitting: Allergy & Immunology

## 2021-11-10 ENCOUNTER — Encounter: Payer: Self-pay | Admitting: Allergy & Immunology

## 2021-11-10 VITALS — BP 122/82 | HR 59 | Temp 98.4°F

## 2021-11-10 DIAGNOSIS — J309 Allergic rhinitis, unspecified: Secondary | ICD-10-CM | POA: Diagnosis not present

## 2021-11-10 DIAGNOSIS — J330 Polyp of nasal cavity: Secondary | ICD-10-CM | POA: Diagnosis not present

## 2021-11-10 DIAGNOSIS — J3089 Other allergic rhinitis: Secondary | ICD-10-CM

## 2021-11-10 DIAGNOSIS — J302 Other seasonal allergic rhinitis: Secondary | ICD-10-CM

## 2021-11-10 NOTE — Patient Instructions (Addendum)
1. Seasonal and perennial allergic rhinitis - with bilateral nasal polyps (MUCH worse on the left side)  - Continue with Xhance one spray per nostril twice daily. - Information on Dupixent provided and Consent signed.  - Tammy will reach out to discuss the approval process.  - Steroid pack provided to use when you need it for your presentation.  - Hopefully we can get that [place] approved!   2. Return in about 3 months (around 02/08/2022).    Please inform us of any Emergency Department visits, hospitalizations, or changes in symptoms. Call us before going to the ED for breathing or allergy symptoms since we might be able to fit you in for a sick visit. Feel free to contact us anytime with any questions, problems, or concerns.  It was a pleasure to see you again today!  Websites that have reliable patient information: 1. American Academy of Asthma, Allergy, and Immunology: www.aaaai.org 2. Food Allergy Research and Education (FARE): foodallergy.org 3. Mothers of Asthmatics: http://www.asthmacommunitynetwork.org 4. American College of Allergy, Asthma, and Immunology: www.acaai.org   COVID-19 Vaccine Information can be found at: [email or web address] For questions related to vaccine distribution or appointments, please email [email or web address] or call [phone].   We realize that you might be concerned about having an allergic reaction to the COVID19 vaccines. To help with that concern, WE ARE OFFERING THE COVID19 VACCINES IN OUR OFFICE! Ask the front desk for dates!     Like [place] on [place] and Instagram for our latest updates!      A healthy democracy works best when [employer] participate! Make sure you are registered to vote! If you have moved or changed any of your contact information, you will need to get this updated before voting!  In some cases, you MAY be able to register to vote online:  [email or web address]

## 2021-11-10 NOTE — Telephone Encounter (Signed)
-----   Message from Alfonse Spruce, MD sent at 11/10/2021  1:21 PM EST ----- Dupixent for nasal polyps?

## 2021-11-10 NOTE — Progress Notes (Signed)
FOLLOW UP  Date of Service/Encounter:  11/10/21   Assessment:   Perennial and seasonal allergic rhinitis (grasses, trees, weeds, molds, dust mite, dogs, cat, cockroach)    Bilateral nasal polyps - worse on the left without much improvement on Xhance  Recurrent prednisone courses (6-7 per year for at least four years running)   Jeannett Senior continues to have bilateral nasal polyposis.  It remains most prominent in his left nare, but it is present bilaterally.  On the left, it is obstructing 100% of his nasal cavity.  On the right, and is obstructing around 75% to 85%.  You can see the nasal polyps on his left without instrumentation.  He has been on [place] with minimal improvement.  He has required 6-7 rounds of prednisone annually.  Therefore, I think Dupixent is the next best step for him.  It would help him to avoid surgery, which he prefers.  Plan/Recommendations:   1. Seasonal and perennial allergic rhinitis - with bilateral nasal polyps (MUCH worse on the left side)  - Continue with Xhance one spray per nostril twice daily. - Information on Dupixent provided and Consent signed.  - Tammy will reach out to discuss the approval process.  - Steroid pack provided to use when you need it for your presentation.  - Hopefully we can get that Dupixent approved!   2. Return in about 3 months (around 02/08/2022).    Subjective:   Barnett Elzey is a 56 y.o. male presenting today for follow up of  Chief Complaint  Patient presents with   Follow-up   Sinusitis    Alando Colleran has a history of the following: Patient Active Problem List   Diagnosis Date Noted   Other allergic rhinitis 11/24/2020   Allergic conjunctivitis of both eyes 11/24/2020    History obtained from: chart review and patient.  Nadim is a 56 y.o. male presenting for a follow up visit. He was last seen in September 2022. At that time, we stopped the fluticasone and started [place].   Since the last visit, he has  been on a number of antibiotics. He started on an antibiotic on Friday. Within the last couple of monts, he had another month. One of them was when he called a teledoc. He was given amoxicillin that did not clear it up. He then got a prednisone burst that knocked. This knocked it out more than the antibiotics. He reports that the prednisone always seems to work more than the antibiotics.   He has never seen ENT, although we had discussed this in the past. He has never had a sinus CT.   He estimates that he needs prednisone 6-7 times per year. This has been ongoing for a period of 3-4 years or so.   He does report that the allergy shots are helping with other symptoms, including his postnasal drip and sneezing and throat clearing. But it has not done much to help with his nasal congestion. He is using the [place] routinely. He does not miss any doses of this. He has been using this routinely.   Otherwise, there have been no changes to his past medical history, surgical history, family history, or social history.    Review of Systems  Constitutional: Negative.  Negative for chills, fever, malaise/fatigue and weight loss.  HENT:  Positive for congestion. Negative for ear discharge and ear pain.   Eyes:  Negative for pain, discharge and redness.  Respiratory:  Negative for cough, sputum production, shortness of breath and  wheezing.   Cardiovascular: Negative.  Negative for chest pain and palpitations.  Gastrointestinal:  Negative for abdominal pain, heartburn, nausea and vomiting.  Skin: Negative.  Negative for itching and rash.  Neurological:  Negative for dizziness and headaches.  Endo/Heme/Allergies:  Negative for environmental allergies. Does not bruise/bleed easily.      Objective:   Blood pressure 122/82, pulse (!) 59, temperature 98.4 F (36.9 C), temperature source Temporal, SpO2 96 %. There is no height or weight on file to calculate BMI.   Physical Exam:  Physical Exam Vitals  reviewed.  Constitutional:      Appearance: He is well-developed.     Comments: Very friendly. Cooperative with the exam.   HENT:     Head: Normocephalic and atraumatic.     Right Ear: Tympanic membrane, ear canal and external ear normal.     Left Ear: Tympanic membrane, ear canal and external ear normal.     Nose: No nasal deformity, septal deviation, mucosal edema or rhinorrhea.     Right Turbinates: Enlarged and swollen.     Left Turbinates: Enlarged and swollen.     Right Sinus: No maxillary sinus tenderness or frontal sinus tenderness.     Left Sinus: No maxillary sinus tenderness or frontal sinus tenderness.     Comments: No epistaxis noted. Bilateral nasal polyps noted today. Left is approximately 100% obstructed. Right is approximately 85% obstructed. Polyps are visible with the naked eye.     Mouth/Throat:     Mouth: Mucous membranes are not pale and not dry.     Pharynx: Uvula midline.  Eyes:     General: Lids are normal. No allergic shiner.       Right eye: No discharge.        Left eye: No discharge.     Conjunctiva/sclera: Conjunctivae normal.     Right eye: Right conjunctiva is not injected. No chemosis.    Left eye: Left conjunctiva is not injected. No chemosis.    Pupils: Pupils are equal, round, and reactive to light.  Cardiovascular:     Rate and Rhythm: Normal rate and regular rhythm.     Heart sounds: Normal heart sounds.  Pulmonary:     Effort: Pulmonary effort is normal. No tachypnea, accessory muscle usage or respiratory distress.     Breath sounds: Normal breath sounds. No wheezing, rhonchi or rales.     Comments: Moving air well in all lung fields. No increased work of breathing noted.  Chest:     Chest wall: No tenderness.  Lymphadenopathy:     Cervical: No cervical adenopathy.  Skin:    Coloration: Skin is not pale.     Findings: No abrasion, erythema, petechiae or rash. Rash is not papular, urticarial or vesicular.  Neurological:     Mental Status:  He is alert.  Psychiatric:        Behavior: Behavior is cooperative.     Diagnostic studies: none     Malachi Bonds, MD  [facility]

## 2021-11-10 NOTE — Telephone Encounter (Signed)
Called patient and advised approval, copay card and submit for Dupixent for nasal polyps. Advised him also of delivery,storage, dosing and bring same into office for initial injection and admin instrux

## 2021-11-12 ENCOUNTER — Other Ambulatory Visit: Payer: Self-pay

## 2021-11-12 MED ORDER — XHANCE 93 MCG/ACT NA EXHU: 1.0000 "application " | INHALANT_SUSPENSION | Freq: Two times a day (BID) | NASAL | 3 refills | Status: DC

## 2021-11-15 ENCOUNTER — Telehealth: Payer: Self-pay | Admitting: *Deleted

## 2021-11-15 NOTE — Telephone Encounter (Signed)
Attempted PA for Xhance through CoverMyMeds. Insurance determination states that the drug is covered under the current benefit plan and that a PA was not currently needed.

## 2021-11-19 ENCOUNTER — Ambulatory Visit (INDEPENDENT_AMBULATORY_CARE_PROVIDER_SITE_OTHER): Payer: 59

## 2021-11-19 ENCOUNTER — Ambulatory Visit: Payer: Self-pay

## 2021-11-19 DIAGNOSIS — J309 Allergic rhinitis, unspecified: Secondary | ICD-10-CM

## 2021-11-24 ENCOUNTER — Ambulatory Visit (INDEPENDENT_AMBULATORY_CARE_PROVIDER_SITE_OTHER): Payer: 59

## 2021-11-24 DIAGNOSIS — J309 Allergic rhinitis, unspecified: Secondary | ICD-10-CM

## 2021-12-01 ENCOUNTER — Other Ambulatory Visit: Payer: Self-pay

## 2021-12-01 ENCOUNTER — Ambulatory Visit: Payer: 59

## 2021-12-01 DIAGNOSIS — J339 Nasal polyp, unspecified: Secondary | ICD-10-CM

## 2021-12-01 NOTE — Progress Notes (Signed)
Immunotherapy   Patient Details  Name: Lucas Hill MRN: [id number] Date of Birth: 02-Dec-1965  12/01/2021  [name] here to start Dupixent for nasal polyp. Patient was shown how to administer his Dupixent at home. Patient demonstrated safe and correct administration and will continue his Dupixent at home.   Frequency: every 2 weeks Epi-Pen:Epi-Pen Available  Consent signed and patient instructions given.   Dub Mikes 12/01/2021, 2:11 PM

## 2021-12-03 ENCOUNTER — Ambulatory Visit (INDEPENDENT_AMBULATORY_CARE_PROVIDER_SITE_OTHER): Payer: 59

## 2021-12-03 DIAGNOSIS — J309 Allergic rhinitis, unspecified: Secondary | ICD-10-CM

## 2021-12-08 ENCOUNTER — Ambulatory Visit (INDEPENDENT_AMBULATORY_CARE_PROVIDER_SITE_OTHER): Payer: 59

## 2021-12-08 DIAGNOSIS — J309 Allergic rhinitis, unspecified: Secondary | ICD-10-CM | POA: Diagnosis not present

## 2021-12-15 DIAGNOSIS — J3089 Other allergic rhinitis: Secondary | ICD-10-CM

## 2021-12-15 NOTE — Progress Notes (Signed)
VIALS EXP 12-15-22 °

## 2021-12-17 ENCOUNTER — Ambulatory Visit (INDEPENDENT_AMBULATORY_CARE_PROVIDER_SITE_OTHER): Payer: 59 | Admitting: *Deleted

## 2021-12-17 DIAGNOSIS — J309 Allergic rhinitis, unspecified: Secondary | ICD-10-CM

## 2021-12-22 ENCOUNTER — Ambulatory Visit (INDEPENDENT_AMBULATORY_CARE_PROVIDER_SITE_OTHER): Payer: 59 | Admitting: *Deleted

## 2021-12-22 DIAGNOSIS — J309 Allergic rhinitis, unspecified: Secondary | ICD-10-CM

## 2022-01-12 ENCOUNTER — Ambulatory Visit (INDEPENDENT_AMBULATORY_CARE_PROVIDER_SITE_OTHER): Payer: 59

## 2022-01-12 DIAGNOSIS — J309 Allergic rhinitis, unspecified: Secondary | ICD-10-CM

## 2022-01-19 ENCOUNTER — Other Ambulatory Visit: Payer: Self-pay | Admitting: Orthopedic Surgery

## 2022-01-19 ENCOUNTER — Other Ambulatory Visit (HOSPITAL_COMMUNITY): Payer: Self-pay | Admitting: Orthopedic Surgery

## 2022-01-19 DIAGNOSIS — M25562 Pain in left knee: Secondary | ICD-10-CM

## 2022-01-26 ENCOUNTER — Ambulatory Visit (INDEPENDENT_AMBULATORY_CARE_PROVIDER_SITE_OTHER): Payer: 59

## 2022-01-26 DIAGNOSIS — J309 Allergic rhinitis, unspecified: Secondary | ICD-10-CM

## 2022-02-02 ENCOUNTER — Ambulatory Visit (INDEPENDENT_AMBULATORY_CARE_PROVIDER_SITE_OTHER): Payer: 59

## 2022-02-02 DIAGNOSIS — J309 Allergic rhinitis, unspecified: Secondary | ICD-10-CM

## 2022-02-04 ENCOUNTER — Ambulatory Visit (HOSPITAL_COMMUNITY)
Admission: RE | Admit: 2022-02-04 | Discharge: 2022-02-04 | Disposition: A | Discharge status: Home / Self Care | Payer: 59 | Source: Ambulatory Visit | Attending: Orthopedic Surgery | Admitting: Orthopedic Surgery

## 2022-02-04 DIAGNOSIS — M25562 Pain in left knee: Secondary | ICD-10-CM | POA: Insufficient documentation

## 2022-02-09 ENCOUNTER — Ambulatory Visit: Payer: Self-pay

## 2022-02-09 ENCOUNTER — Ambulatory Visit (INDEPENDENT_AMBULATORY_CARE_PROVIDER_SITE_OTHER): Payer: 59 | Admitting: Allergy & Immunology

## 2022-02-09 ENCOUNTER — Encounter: Payer: Self-pay | Admitting: Allergy & Immunology

## 2022-02-09 VITALS — BP 126/82 | HR 63 | Temp 98.1°F | Resp 18 | Ht 73.0 in | Wt 213.8 lb

## 2022-02-09 DIAGNOSIS — J309 Allergic rhinitis, unspecified: Secondary | ICD-10-CM

## 2022-02-09 DIAGNOSIS — J302 Other seasonal allergic rhinitis: Secondary | ICD-10-CM

## 2022-02-09 DIAGNOSIS — J339 Nasal polyp, unspecified: Secondary | ICD-10-CM | POA: Diagnosis not present

## 2022-02-09 NOTE — Patient Instructions (Addendum)
1. Seasonal and perennial allergic rhinitis - with bilateral nasal polyps but much much better (I cannot see it on the left side and it improved on the right side) ?- Continue with Xhance one spray per nostril twice daily (decrease if the copays are expensive)  ?- Continue with allergy shots (will be spacing out to every 2 weeks soon).  ?- Steroid pack provided just in case.  ?- Continue with the Dupixent. ? ?2. Return in about 1 year (around 02/10/2023).  ? ? ?Please inform us of any Emergency Department visits, hospitalizations, or changes in symptoms. Call us before going to the ED for breathing or allergy symptoms since we might be able to fit you in for a sick visit. Feel free to contact us anytime with any questions, problems, or concerns. ? ?It was a pleasure to see you again today! ? ?Websites that have reliable patient information: ?1. American Academy of Asthma, Allergy, and Immunology: www.aaaai.org ?2. Food Allergy Research and Education (FARE): foodallergy.org ?3. Mothers of Asthmatics: http://www.asthmacommunitynetwork.org ?4. [employer] of Allergy, Asthma, and Immunology: MissingWeapons.ca ? ? ?COVID-19 Vaccine Information can be found at: [email or web address].[email or web address] For questions related to vaccine distribution or appointments, please email [email or web address] or call [phone].  ? ?We realize that you might be concerned about having an allergic reaction to the COVID19 vaccines. To help with that concern, WE ARE OFFERING THE COVID19 VACCINES IN OUR OFFICE! Ask the front desk for dates!  ? ? ? ??Like? [place] on Facebook and Instagram for our latest updates!  ?  ? ? ?A healthy democracy works best when [employer] participate! Make sure you are registered to vote! If you have moved or changed any of your contact information, you will need to get this updated before voting! ? ?In some cases, you MAY be able to register to vote online:  [email or web address] ? ? ? ? ?

## 2022-02-09 NOTE — Progress Notes (Signed)
? ?FOLLOW UP ? ?Date of Service/Encounter:  02/09/22 ? ? ?Assessment:  ? ?Perennial and seasonal allergic rhinitis (grasses, trees, weeds, molds, dust mite, dogs, cat, cockroach) - with maintenance reached November 2022 ?  ?Bilateral nasal polyps - worse on the left with marked improvement on Xhance plus Dupixent ?  ?Recurrent prednisone courses (6-7 per year for at least four years running) ? ? ?Lucas Hill is doing remarkably well with his Dupixent in combination with his allergen immunotherapy.  He has felt immediate improvement with his nasal polyps even after the first injection.  He has given them at home in his abdomen and having no adverse events.  He is quite pleased with how well he is doing.  This is probably the longest he has gone without prednisone in several years. ? ?Plan/Recommendations:  ? ?1. Seasonal and perennial allergic rhinitis - with bilateral nasal polyps but much much better (I cannot see it on the left side and it improved on the right side) ?- Continue with Xhance one spray per nostril twice daily (decrease if the copays are expensive)  ?- Continue with allergy shots (will be spacing out to every 2 weeks soon).  ?- Steroid pack provided just in case.  ?- Continue with the Dupixent. ? ?2. Return in about 1 year (around 02/10/2023).  ? ? ?Subjective:  ? ?Lucas Hill is a 56 y.o. male presenting today for follow up of  ?Chief Complaint  ?Patient presents with  ? Other  ?  Follow up for nasal polyps   ? ? ?Lucas Hill has a history of the following: ?Patient Active Problem List  ? Diagnosis Date Noted  ? Other allergic rhinitis 11/24/2020  ? Allergic conjunctivitis of both eyes 11/24/2020  ? ? ?History obtained from: chart review and patient. ? ?Lucas Hill is a 56 y.o. male presenting for a follow up visit.  He was last seen in January 2023.  At that time, he was continuing to suffer from bilateral nasal polyposis.  It was most prominent in his left nose, but was present bilaterally.  He had  been on Xhance without any improvement and had required 6-7 rounds of prednisone annually.  We decided to start [place], which was approved. ? ?He discharged exam January 2023. Wthin a week or two, he felt better. He has not looked up there but he feels that they have shrunk. The copay card is working well.  He is able to breathe through his nose and he has not required any prednisone since I gave him a course in January.  This is the longest he has gone without prednisone in several years.  He feels very much improved since we saw him. ? ?Lucas Hill is on allergen immunotherapy. He receives two injections. Immunotherapy script #1 contains trees, weeds, grasses, and dust mites. He currently receives 0.74mL of the RED vial (1/100). Immunotherapy script #2 contains molds, cat, dog, and cockroach. He currently receives 0.7mL of the RED vial (1/100). He started shots May of 2022 and reached maintenance in November of 2022. ? ?Otherwise, there have been no changes to his past medical history, surgical history, family history, or social history. ? ? ? ?Review of Systems  ?Constitutional: Negative.  Negative for fever, malaise/fatigue and weight loss.  ?HENT:  Positive for congestion. Negative for ear discharge, ear pain and sinus pain.   ?Eyes:  Negative for pain, discharge and redness.  ?Respiratory:  Negative for cough, sputum production, shortness of breath and wheezing.   ?Cardiovascular: Negative.  Negative for chest pain and palpitations.  ?Gastrointestinal:  Negative for abdominal pain, heartburn, nausea and vomiting.  ?Skin: Negative.  Negative for itching and rash.  ?Neurological:  Negative for dizziness and headaches.  ?Endo/Heme/Allergies:  Negative for environmental allergies. Does not bruise/bleed easily.   ? ? ? ?Objective:  ? ?Blood pressure 126/82, pulse 63, temperature 98.1 ?F (36.7 ?C), resp. rate 18, height 6\' 1"  (1.854 m), weight 213 lb 12.8 oz (97 kg), SpO2 96 %. ?Body mass index is 28.21  kg/m?. ? ? ? ?Physical Exam ?Vitals reviewed.  ?Constitutional:   ?   Appearance: He is well-developed.  ?   Comments: Very friendly. Cooperative with the exam.   ?HENT:  ?   Head: Normocephalic and atraumatic.  ?   Right Ear: Tympanic membrane, ear canal and external ear normal.  ?   Left Ear: Tympanic membrane, ear canal and external ear normal.  ?   Nose: No nasal deformity, septal deviation, mucosal edema or rhinorrhea.  ?   Right Turbinates: Enlarged and swollen.  ?   Left Turbinates: Enlarged and swollen.  ?   Right Sinus: No maxillary sinus tenderness or frontal sinus tenderness.  ?   Left Sinus: No maxillary sinus tenderness or frontal sinus tenderness.  ?   Comments: His polyps bilaterally have markedly improved.  The one on the right seems to be obstructing around 50% of the passage.  The one on the left has resolved to the point where it is almost difficult to see. ?   Mouth/Throat:  ?   Mouth: Mucous membranes are not pale and not dry.  ?   Pharynx: Uvula midline.  ?Eyes:  ?   General: Lids are normal. No allergic shiner.    ?   Right eye: No discharge.     ?   Left eye: No discharge.  ?   Conjunctiva/sclera: Conjunctivae normal.  ?   Right eye: Right conjunctiva is not injected. No chemosis. ?   Left eye: Left conjunctiva is not injected. No chemosis. ?   Pupils: Pupils are equal, round, and reactive to light.  ?Cardiovascular:  ?   Rate and Rhythm: Normal rate and regular rhythm.  ?   Heart sounds: Normal heart sounds.  ?Pulmonary:  ?   Effort: Pulmonary effort is normal. No tachypnea, accessory muscle usage or respiratory distress.  ?   Breath sounds: Normal breath sounds. No wheezing, rhonchi or rales.  ?   Comments: Moving air well in all lung fields. No increased work of breathing noted.  ?Chest:  ?   Chest wall: No tenderness.  ?Lymphadenopathy:  ?   Cervical: No cervical adenopathy.  ?Skin: ?   Coloration: Skin is not pale.  ?   Findings: No abrasion, erythema, petechiae or rash. Rash is not  papular, urticarial or vesicular.  ?Neurological:  ?   Mental Status: He is alert.  ?Psychiatric:     ?   Behavior: Behavior is cooperative.  ?  ? ?Diagnostic studies: none ? ? ? ? ?  ?Salvatore Marvel, MD  ?[facility] ? ? ? ? ? ? ?

## 2022-02-10 ENCOUNTER — Encounter: Payer: Self-pay | Admitting: Allergy & Immunology

## 2022-02-18 ENCOUNTER — Ambulatory Visit (INDEPENDENT_AMBULATORY_CARE_PROVIDER_SITE_OTHER): Payer: 59

## 2022-02-18 DIAGNOSIS — J309 Allergic rhinitis, unspecified: Secondary | ICD-10-CM

## 2022-02-25 ENCOUNTER — Ambulatory Visit (INDEPENDENT_AMBULATORY_CARE_PROVIDER_SITE_OTHER): Payer: 59

## 2022-02-25 DIAGNOSIS — J309 Allergic rhinitis, unspecified: Secondary | ICD-10-CM | POA: Diagnosis not present

## 2022-03-04 ENCOUNTER — Ambulatory Visit (INDEPENDENT_AMBULATORY_CARE_PROVIDER_SITE_OTHER): Payer: 59

## 2022-03-04 DIAGNOSIS — J309 Allergic rhinitis, unspecified: Secondary | ICD-10-CM | POA: Diagnosis not present

## 2022-03-18 ENCOUNTER — Ambulatory Visit (INDEPENDENT_AMBULATORY_CARE_PROVIDER_SITE_OTHER): Payer: 59

## 2022-03-18 DIAGNOSIS — J309 Allergic rhinitis, unspecified: Secondary | ICD-10-CM

## 2022-03-23 ENCOUNTER — Ambulatory Visit (INDEPENDENT_AMBULATORY_CARE_PROVIDER_SITE_OTHER): Payer: 59

## 2022-03-23 DIAGNOSIS — J309 Allergic rhinitis, unspecified: Secondary | ICD-10-CM | POA: Diagnosis not present

## 2022-04-05 DIAGNOSIS — J3089 Other allergic rhinitis: Secondary | ICD-10-CM

## 2022-04-05 NOTE — Progress Notes (Signed)
VIALS EXP 04-06-23 

## 2022-04-27 ENCOUNTER — Ambulatory Visit (INDEPENDENT_AMBULATORY_CARE_PROVIDER_SITE_OTHER): Payer: 59

## 2022-04-27 DIAGNOSIS — J309 Allergic rhinitis, unspecified: Secondary | ICD-10-CM

## 2022-05-04 ENCOUNTER — Ambulatory Visit (INDEPENDENT_AMBULATORY_CARE_PROVIDER_SITE_OTHER): Payer: 59

## 2022-05-04 DIAGNOSIS — J309 Allergic rhinitis, unspecified: Secondary | ICD-10-CM | POA: Diagnosis not present

## 2022-05-11 ENCOUNTER — Ambulatory Visit (INDEPENDENT_AMBULATORY_CARE_PROVIDER_SITE_OTHER): Payer: 59

## 2022-05-11 DIAGNOSIS — J309 Allergic rhinitis, unspecified: Secondary | ICD-10-CM | POA: Diagnosis not present

## 2022-05-25 ENCOUNTER — Ambulatory Visit (INDEPENDENT_AMBULATORY_CARE_PROVIDER_SITE_OTHER): Payer: 59

## 2022-05-25 DIAGNOSIS — J309 Allergic rhinitis, unspecified: Secondary | ICD-10-CM | POA: Diagnosis not present

## 2022-06-03 ENCOUNTER — Ambulatory Visit (INDEPENDENT_AMBULATORY_CARE_PROVIDER_SITE_OTHER): Payer: 59

## 2022-06-03 DIAGNOSIS — J309 Allergic rhinitis, unspecified: Secondary | ICD-10-CM

## 2022-06-22 ENCOUNTER — Ambulatory Visit (INDEPENDENT_AMBULATORY_CARE_PROVIDER_SITE_OTHER): Payer: 59

## 2022-06-22 DIAGNOSIS — J309 Allergic rhinitis, unspecified: Secondary | ICD-10-CM | POA: Diagnosis not present

## 2022-06-29 ENCOUNTER — Ambulatory Visit (INDEPENDENT_AMBULATORY_CARE_PROVIDER_SITE_OTHER): Payer: 59

## 2022-06-29 DIAGNOSIS — J309 Allergic rhinitis, unspecified: Secondary | ICD-10-CM

## 2022-07-06 ENCOUNTER — Ambulatory Visit (INDEPENDENT_AMBULATORY_CARE_PROVIDER_SITE_OTHER): Payer: 59

## 2022-07-06 DIAGNOSIS — J309 Allergic rhinitis, unspecified: Secondary | ICD-10-CM

## 2022-07-13 ENCOUNTER — Ambulatory Visit (INDEPENDENT_AMBULATORY_CARE_PROVIDER_SITE_OTHER): Payer: 59

## 2022-07-13 DIAGNOSIS — J309 Allergic rhinitis, unspecified: Secondary | ICD-10-CM | POA: Diagnosis not present

## 2022-07-20 ENCOUNTER — Ambulatory Visit (INDEPENDENT_AMBULATORY_CARE_PROVIDER_SITE_OTHER): Payer: 59

## 2022-07-20 DIAGNOSIS — J309 Allergic rhinitis, unspecified: Secondary | ICD-10-CM

## 2022-07-29 ENCOUNTER — Ambulatory Visit (INDEPENDENT_AMBULATORY_CARE_PROVIDER_SITE_OTHER): Payer: 59

## 2022-07-29 DIAGNOSIS — J309 Allergic rhinitis, unspecified: Secondary | ICD-10-CM | POA: Diagnosis not present

## 2022-08-05 ENCOUNTER — Ambulatory Visit (INDEPENDENT_AMBULATORY_CARE_PROVIDER_SITE_OTHER): Payer: 59

## 2022-08-05 DIAGNOSIS — J309 Allergic rhinitis, unspecified: Secondary | ICD-10-CM

## 2022-08-10 ENCOUNTER — Ambulatory Visit (INDEPENDENT_AMBULATORY_CARE_PROVIDER_SITE_OTHER): Payer: 59

## 2022-08-10 DIAGNOSIS — J309 Allergic rhinitis, unspecified: Secondary | ICD-10-CM

## 2022-08-19 ENCOUNTER — Ambulatory Visit (INDEPENDENT_AMBULATORY_CARE_PROVIDER_SITE_OTHER): Payer: 59

## 2022-08-19 DIAGNOSIS — J309 Allergic rhinitis, unspecified: Secondary | ICD-10-CM

## 2022-08-22 ENCOUNTER — Other Ambulatory Visit: Payer: Self-pay | Admitting: Allergy & Immunology

## 2022-08-24 ENCOUNTER — Ambulatory Visit (INDEPENDENT_AMBULATORY_CARE_PROVIDER_SITE_OTHER): Payer: 59

## 2022-08-24 DIAGNOSIS — J309 Allergic rhinitis, unspecified: Secondary | ICD-10-CM | POA: Diagnosis not present

## 2022-08-25 DIAGNOSIS — J3089 Other allergic rhinitis: Secondary | ICD-10-CM | POA: Diagnosis not present

## 2022-08-25 NOTE — Progress Notes (Signed)
VIALS EXP 08-26-23 

## 2022-09-07 ENCOUNTER — Ambulatory Visit (INDEPENDENT_AMBULATORY_CARE_PROVIDER_SITE_OTHER): Payer: 59 | Admitting: *Deleted

## 2022-09-07 DIAGNOSIS — J309 Allergic rhinitis, unspecified: Secondary | ICD-10-CM | POA: Diagnosis not present

## 2022-09-21 ENCOUNTER — Ambulatory Visit (INDEPENDENT_AMBULATORY_CARE_PROVIDER_SITE_OTHER): Payer: 59

## 2022-09-21 DIAGNOSIS — J309 Allergic rhinitis, unspecified: Secondary | ICD-10-CM | POA: Diagnosis not present

## 2022-09-28 ENCOUNTER — Ambulatory Visit (INDEPENDENT_AMBULATORY_CARE_PROVIDER_SITE_OTHER): Payer: 59

## 2022-09-28 DIAGNOSIS — J309 Allergic rhinitis, unspecified: Secondary | ICD-10-CM

## 2022-10-05 ENCOUNTER — Ambulatory Visit (INDEPENDENT_AMBULATORY_CARE_PROVIDER_SITE_OTHER): Payer: 59

## 2022-10-05 DIAGNOSIS — J309 Allergic rhinitis, unspecified: Secondary | ICD-10-CM | POA: Diagnosis not present

## 2022-10-12 ENCOUNTER — Ambulatory Visit (INDEPENDENT_AMBULATORY_CARE_PROVIDER_SITE_OTHER): Payer: 59

## 2022-10-12 DIAGNOSIS — J309 Allergic rhinitis, unspecified: Secondary | ICD-10-CM | POA: Diagnosis not present

## 2022-10-19 ENCOUNTER — Ambulatory Visit (INDEPENDENT_AMBULATORY_CARE_PROVIDER_SITE_OTHER): Payer: 59

## 2022-10-19 DIAGNOSIS — J309 Allergic rhinitis, unspecified: Secondary | ICD-10-CM | POA: Diagnosis not present

## 2022-10-28 ENCOUNTER — Ambulatory Visit (INDEPENDENT_AMBULATORY_CARE_PROVIDER_SITE_OTHER): Payer: 59

## 2022-10-28 DIAGNOSIS — J309 Allergic rhinitis, unspecified: Secondary | ICD-10-CM | POA: Diagnosis not present

## 2022-11-09 ENCOUNTER — Ambulatory Visit (INDEPENDENT_AMBULATORY_CARE_PROVIDER_SITE_OTHER): Payer: 59

## 2022-11-09 DIAGNOSIS — J309 Allergic rhinitis, unspecified: Secondary | ICD-10-CM

## 2022-11-18 ENCOUNTER — Ambulatory Visit (INDEPENDENT_AMBULATORY_CARE_PROVIDER_SITE_OTHER): Payer: 59

## 2022-11-18 DIAGNOSIS — J309 Allergic rhinitis, unspecified: Secondary | ICD-10-CM | POA: Diagnosis not present

## 2022-11-30 ENCOUNTER — Ambulatory Visit (INDEPENDENT_AMBULATORY_CARE_PROVIDER_SITE_OTHER): Payer: 59

## 2022-11-30 DIAGNOSIS — J309 Allergic rhinitis, unspecified: Secondary | ICD-10-CM | POA: Diagnosis not present

## 2022-12-16 ENCOUNTER — Ambulatory Visit (INDEPENDENT_AMBULATORY_CARE_PROVIDER_SITE_OTHER): Payer: 59

## 2022-12-16 DIAGNOSIS — J309 Allergic rhinitis, unspecified: Secondary | ICD-10-CM

## 2022-12-26 DIAGNOSIS — J3089 Other allergic rhinitis: Secondary | ICD-10-CM | POA: Diagnosis not present

## 2022-12-26 NOTE — Progress Notes (Signed)
VIALS EXP 12-27-23

## 2022-12-28 ENCOUNTER — Ambulatory Visit (INDEPENDENT_AMBULATORY_CARE_PROVIDER_SITE_OTHER): Payer: 59

## 2022-12-28 DIAGNOSIS — J309 Allergic rhinitis, unspecified: Secondary | ICD-10-CM

## 2023-01-06 ENCOUNTER — Ambulatory Visit (INDEPENDENT_AMBULATORY_CARE_PROVIDER_SITE_OTHER): Payer: 59

## 2023-01-06 DIAGNOSIS — J309 Allergic rhinitis, unspecified: Secondary | ICD-10-CM | POA: Diagnosis not present

## 2023-01-13 ENCOUNTER — Ambulatory Visit (INDEPENDENT_AMBULATORY_CARE_PROVIDER_SITE_OTHER): Payer: 59

## 2023-01-13 DIAGNOSIS — J309 Allergic rhinitis, unspecified: Secondary | ICD-10-CM | POA: Diagnosis not present

## 2023-01-18 ENCOUNTER — Ambulatory Visit (INDEPENDENT_AMBULATORY_CARE_PROVIDER_SITE_OTHER): Payer: 59

## 2023-01-18 DIAGNOSIS — J309 Allergic rhinitis, unspecified: Secondary | ICD-10-CM

## 2023-01-27 ENCOUNTER — Ambulatory Visit (INDEPENDENT_AMBULATORY_CARE_PROVIDER_SITE_OTHER): Payer: 59

## 2023-01-27 DIAGNOSIS — J309 Allergic rhinitis, unspecified: Secondary | ICD-10-CM

## 2023-02-01 ENCOUNTER — Ambulatory Visit (INDEPENDENT_AMBULATORY_CARE_PROVIDER_SITE_OTHER): Payer: 59

## 2023-02-01 DIAGNOSIS — J309 Allergic rhinitis, unspecified: Secondary | ICD-10-CM

## 2023-02-15 ENCOUNTER — Ambulatory Visit (INDEPENDENT_AMBULATORY_CARE_PROVIDER_SITE_OTHER): Payer: 59

## 2023-02-15 DIAGNOSIS — J309 Allergic rhinitis, unspecified: Secondary | ICD-10-CM

## 2023-02-15 NOTE — Progress Notes (Signed)
VIALS EXP 02-15-24 

## 2023-02-16 DIAGNOSIS — J3089 Other allergic rhinitis: Secondary | ICD-10-CM | POA: Diagnosis not present

## 2023-03-03 ENCOUNTER — Ambulatory Visit (INDEPENDENT_AMBULATORY_CARE_PROVIDER_SITE_OTHER): Payer: 59

## 2023-03-03 DIAGNOSIS — J309 Allergic rhinitis, unspecified: Secondary | ICD-10-CM

## 2023-03-10 ENCOUNTER — Ambulatory Visit (INDEPENDENT_AMBULATORY_CARE_PROVIDER_SITE_OTHER): Payer: 59

## 2023-03-10 DIAGNOSIS — J309 Allergic rhinitis, unspecified: Secondary | ICD-10-CM | POA: Diagnosis not present

## 2023-03-21 ENCOUNTER — Telehealth: Payer: Self-pay | Admitting: *Deleted

## 2023-03-21 NOTE — Telephone Encounter (Signed)
L/m for patient to contact clinic for MD appt for Dupixent reapproval 

## 2023-03-22 ENCOUNTER — Ambulatory Visit (INDEPENDENT_AMBULATORY_CARE_PROVIDER_SITE_OTHER): Payer: 59

## 2023-03-22 DIAGNOSIS — J309 Allergic rhinitis, unspecified: Secondary | ICD-10-CM

## 2023-04-07 ENCOUNTER — Other Ambulatory Visit: Payer: Self-pay

## 2023-04-07 ENCOUNTER — Encounter: Payer: Self-pay | Admitting: Allergy & Immunology

## 2023-04-07 ENCOUNTER — Ambulatory Visit (INDEPENDENT_AMBULATORY_CARE_PROVIDER_SITE_OTHER): Payer: 59 | Admitting: Allergy & Immunology

## 2023-04-07 ENCOUNTER — Ambulatory Visit (INDEPENDENT_AMBULATORY_CARE_PROVIDER_SITE_OTHER): Payer: 59

## 2023-04-07 VITALS — BP 112/68 | HR 76 | Temp 98.6°F | Resp 18 | Ht 74.0 in | Wt 218.5 lb

## 2023-04-07 DIAGNOSIS — J302 Other seasonal allergic rhinitis: Secondary | ICD-10-CM | POA: Diagnosis not present

## 2023-04-07 DIAGNOSIS — J3089 Other allergic rhinitis: Secondary | ICD-10-CM

## 2023-04-07 DIAGNOSIS — J339 Nasal polyp, unspecified: Secondary | ICD-10-CM

## 2023-04-07 DIAGNOSIS — J309 Allergic rhinitis, unspecified: Secondary | ICD-10-CM

## 2023-04-07 NOTE — Progress Notes (Signed)
FOLLOW UP  Date of Service/Encounter:  04/07/23   Assessment:   Perennial and seasonal allergic rhinitis (grasses, trees, weeds, molds, dust mite, dogs, cat, cockroach) - with maintenance reached November 2022   Bilateral nasal polyps - worse on the left with marked improvement on Xhance plus Dupixent   Recurrent prednisone courses (6-7 per year for at least four years running)  Plan/Recommendations:    There are no Patient Instructions on file for this visit.   Subjective:   Lucas Hill is a 57 y.o. male presenting today for follow up of  Chief Complaint  Patient presents with   Follow-up    Lucas Hill has a history of the following: Patient Active Problem List   Diagnosis Date Noted   Other allergic rhinitis 11/24/2020   Allergic conjunctivitis of both eyes 11/24/2020    History obtained from: chart review and {Persons; PED relatives w/patient:19415::"patient"}.  Lucas Hill is a 57 y.o. male presenting for {Blank single:19197::"a food challenge","a drug challenge","skin testing","a sick visit","an evaluation of ***","a follow up visit"}. He was last seen in April 2023. At that time, he was doing very well on the Dupixent and allergen immunotherapy. We continued with Xhance one spray per nostril twice daily as well as allergy shots.   {Blank single:19197::"Asthma/Respiratory Symptom History: ***"," "}  Allergic Rhinitis Symptom History: He is doing well with his Dupixent. He is still not paying anything for it. He has not needed prednisone since the last time I saw him. He has not had a sinus infection at all. He is much better now.   Lucas Hill is on allergen immunotherapy. He receives two injections. Immunotherapy script #1 contains trees, weeds, grasses, and dust mites. He currently receives 0.109mL of the RED vial (1/100). Immunotherapy script #2 contains molds, cat, dog, and cockroach. He currently receives 0.32mL of the RED vial (1/100). He started shots May of 2022  and reached maintenance in November of 2022.   Shots are going well. She loves [place]. He is now going every other week now. He is not having any allergic rhinitis at all. He was on [place]. He has not been using it.   {Blank single:19197::"Food Allergy Symptom History: ***"," "}  {Blank single:19197::"Skin Symptom History: ***"," "}  {Blank single:19197::"GERD Symptom History: ***"," "}  Otherwise, there have been no changes to his past medical history, surgical history, family history, or social history.    ROS     Objective:   Blood pressure 112/68, pulse 76, temperature 98.6 F (37 C), resp. rate 18, height 6\' 2"  (1.88 m), weight 218 lb 8 oz (99.1 kg), SpO2 95 %. Body mass index is 28.05 kg/m.    Physical Exam   Diagnostic studies: {Blank single:19197::"none","deferred due to recent antihistamine use","labs sent instead"," "}  Spirometry: {Blank single:19197::"results normal (FEV1: ***%, FVC: ***%, FEV1/FVC: ***%)","results abnormal (FEV1: ***%, FVC: ***%, FEV1/FVC: ***%)"}.    {Blank single:19197::"Spirometry consistent with mild obstructive disease","Spirometry consistent with moderate obstructive disease","Spirometry consistent with severe obstructive disease","Spirometry consistent with possible restrictive disease","Spirometry consistent with mixed obstructive and restrictive disease","Spirometry uninterpretable due to technique","Spirometry consistent with normal pattern"}. {Blank single:19197::"Albuterol/Atrovent nebulizer","Xopenex/Atrovent nebulizer","Albuterol nebulizer","Albuterol four puffs via MDI","Xopenex four puffs via MDI"} treatment given in clinic with {Blank single:19197::"significant improvement in FEV1 per ATS criteria","significant improvement in FVC per ATS criteria","significant improvement in FEV1 and FVC per ATS criteria","improvement in FEV1, but not significant per ATS criteria","improvement in FVC, but not significant per ATS criteria","improvement in  FEV1 and FVC, but not significant per ATS criteria","no improvement"}.  Allergy Studies: {Blank single:19197::"none","labs sent instead"," "}    {Blank single:19197::"Allergy testing results were read and interpreted by myself, documented by clinical staff."," "}      Malachi Bonds, MD  [facility]

## 2023-04-07 NOTE — Patient Instructions (Addendum)
1. Seasonal and perennial allergic rhinitis - with bilateral nasal polyps but much much better (I cannot see it on the left side and it improved on the right side) - Continue with Xhance one spray per nostril twice daily (decrease if the copays are expensive)  - Continue with allergy shots. - Continue with the Dupixent, but space out to every 3 weeks.  2. Return in about 1 year (around 04/06/2024).    Please inform us of any Emergency Department visits, hospitalizations, or changes in symptoms. Call us before going to the ED for breathing or allergy symptoms since we might be able to fit you in for a sick visit. Feel free to contact us anytime with any questions, problems, or concerns.  It was a pleasure to see you again today!  Websites that have reliable patient information: 1. American Academy of Asthma, Allergy, and Immunology: www.aaaai.org 2. Food Allergy Research and Education (FARE): foodallergy.org 3. Mothers of Asthmatics: http://www.asthmacommunitynetwork.org 4. American College of Allergy, Asthma, and Immunology: www.acaai.org   COVID-19 Vaccine Information can be found at: [email or web address] For questions related to vaccine distribution or appointments, please email [email or web address] or call [phone].   We realize that you might be concerned about having an allergic reaction to the COVID19 vaccines. To help with that concern, WE ARE OFFERING THE COVID19 VACCINES IN OUR OFFICE! Ask the front desk for dates!     "Like" [place] on Facebook and Instagram for our latest updates!      A healthy democracy works best when [employer] participate! Make sure you are registered to vote! If you have moved or changed any of your contact information, you will need to get this updated before voting!  In some cases, you MAY be able to register to vote online: [email or web address]

## 2023-04-10 ENCOUNTER — Encounter: Payer: Self-pay | Admitting: Allergy & Immunology

## 2023-04-19 ENCOUNTER — Ambulatory Visit (INDEPENDENT_AMBULATORY_CARE_PROVIDER_SITE_OTHER): Payer: 59

## 2023-04-19 DIAGNOSIS — J309 Allergic rhinitis, unspecified: Secondary | ICD-10-CM

## 2023-05-19 ENCOUNTER — Ambulatory Visit (INDEPENDENT_AMBULATORY_CARE_PROVIDER_SITE_OTHER): Payer: 59

## 2023-05-19 DIAGNOSIS — J309 Allergic rhinitis, unspecified: Secondary | ICD-10-CM | POA: Diagnosis not present

## 2023-06-02 ENCOUNTER — Ambulatory Visit (INDEPENDENT_AMBULATORY_CARE_PROVIDER_SITE_OTHER): Payer: 59

## 2023-06-02 DIAGNOSIS — J309 Allergic rhinitis, unspecified: Secondary | ICD-10-CM | POA: Diagnosis not present

## 2023-06-16 ENCOUNTER — Ambulatory Visit (INDEPENDENT_AMBULATORY_CARE_PROVIDER_SITE_OTHER): Payer: 59

## 2023-06-16 DIAGNOSIS — J309 Allergic rhinitis, unspecified: Secondary | ICD-10-CM

## 2023-06-21 ENCOUNTER — Ambulatory Visit (INDEPENDENT_AMBULATORY_CARE_PROVIDER_SITE_OTHER): Payer: 59

## 2023-06-21 DIAGNOSIS — J309 Allergic rhinitis, unspecified: Secondary | ICD-10-CM

## 2023-06-23 ENCOUNTER — Other Ambulatory Visit: Payer: Self-pay | Admitting: Allergy & Immunology

## 2023-07-02 IMAGING — MR MR KNEE*L* W/O CM
6 series · 40 of 40 positions shown · non-contrast
Comparison: None.

CLINICAL DATA: Acute pain of left knee for 6-8 weeks.

EXAM:
MRI OF THE LEFT KNEE WITHOUT CONTRAST
TECHNIQUE: Multiplanar, multisequence MR imaging of the knee was performed. No
intravenous contrast was administered.

[Series 16: T2 fat-sat · axial · left · 4.0mm · 0.47mm/px · z∈[-101,+24]mm · 7 of 26 slices shown (1 of 3)]
[im 1/26]
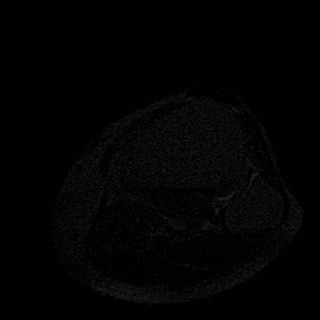
[im 5/26]
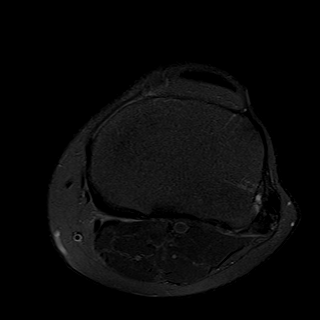
[im 9/26]
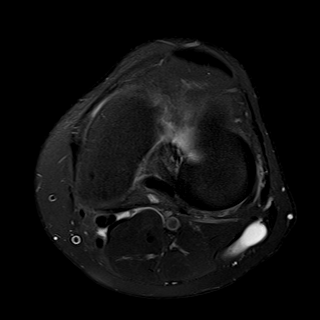
[im 13/26]
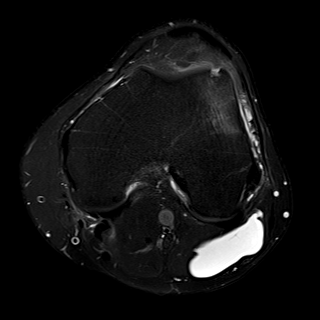
[im 17/26]
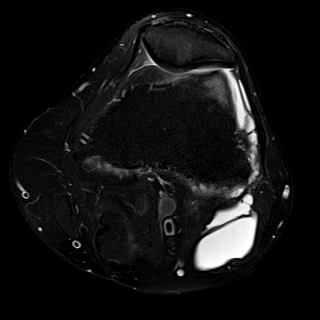
[im 21/26]
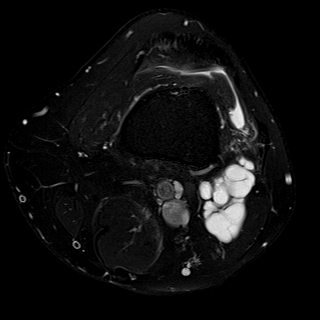
[im 26/26]
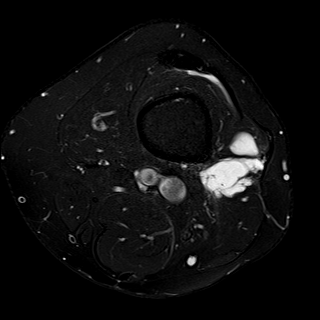

[Series 17: T1 · coronal · left · 4.0mm · 0.59mm/px · 6 of 28 slices shown]
[im 1/28]
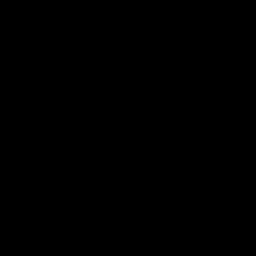
[im 6/28]
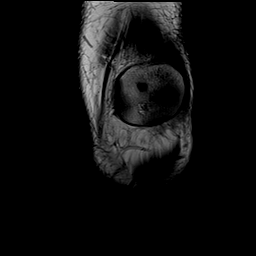
[im 11/28]
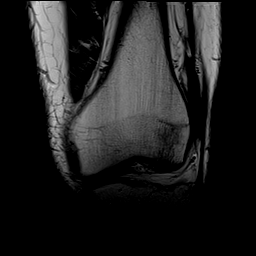
[im 17/28]
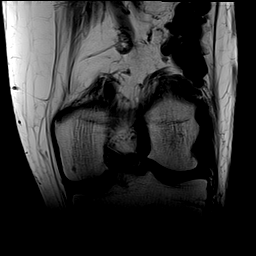
[im 22/28]
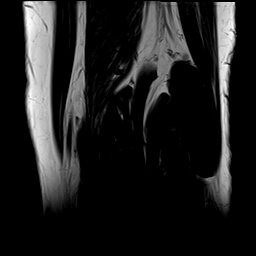
[im 28/28]
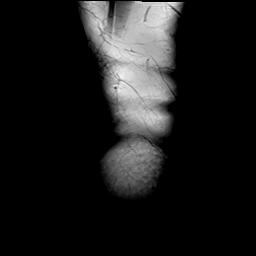

[Series 18: T2 fat-sat · coronal · left · 4.0mm · 0.59mm/px · 6 of 27 slices shown (2 of 3)]
[im 1/27]
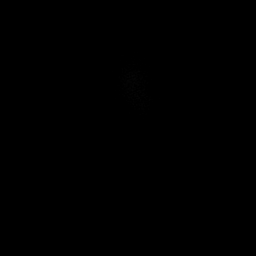
[im 6/27]
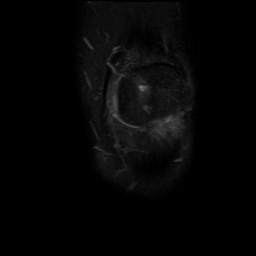
[im 11/27]
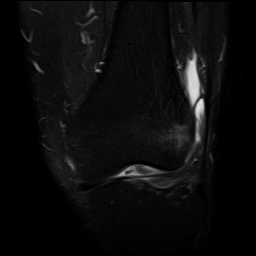
[im 16/27]
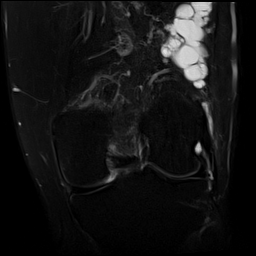
[im 21/27]
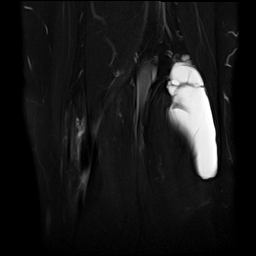
[im 27/27]
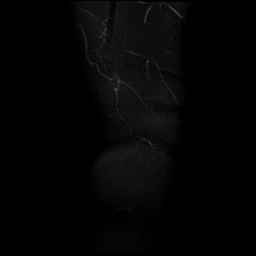

[Series 19: PD fat-sat · coronal · left · 3.0mm · 0.59mm/px · 9 of 39 slices shown (1 of 2)]
[im 1/39]
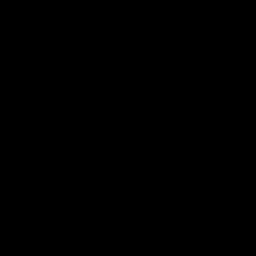
[im 5/39]
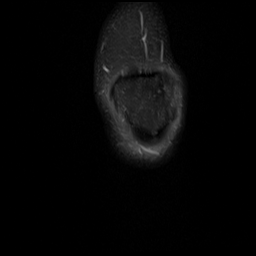
[im 10/39]
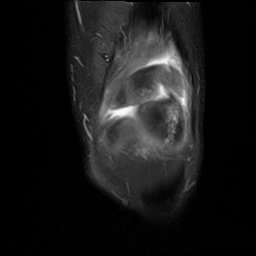
[im 15/39]
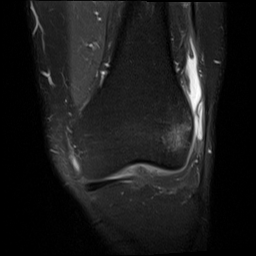
[im 20/39]
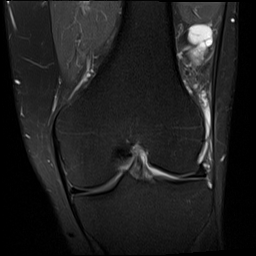
[im 24/39]
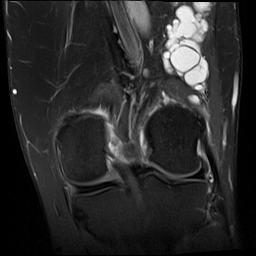
[im 29/39]
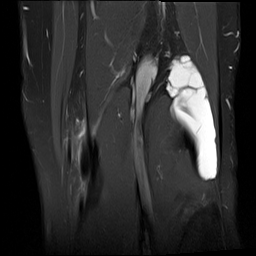
[im 34/39]
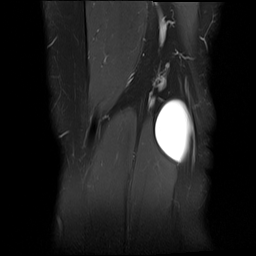
[im 39/39]
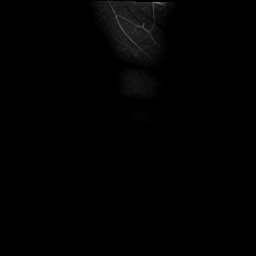

[Series 20: PD fat-sat · sagittal · left · 3.0mm · 0.59mm/px · 6 of 28 slices shown (2 of 2)]
[im 1/28]
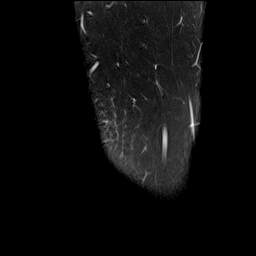
[im 6/28]
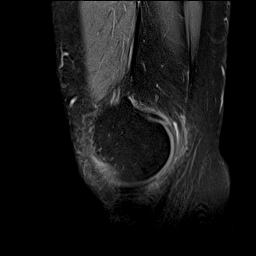
[im 11/28]
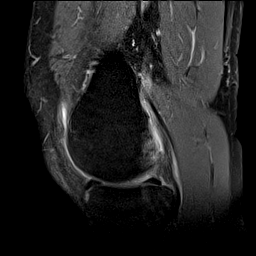
[im 17/28]
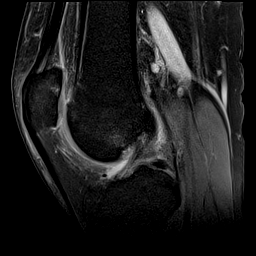
[im 22/28]
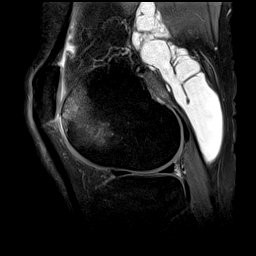
[im 28/28]
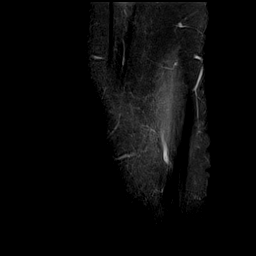

[Series 21: T2 fat-sat · sagittal · left · 3.0mm · 0.59mm/px · 6 of 27 slices shown (3 of 3)]
[im 1/27]
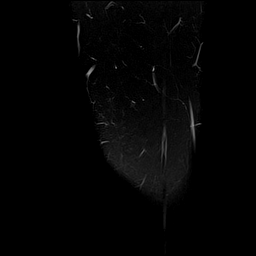
[im 6/27]
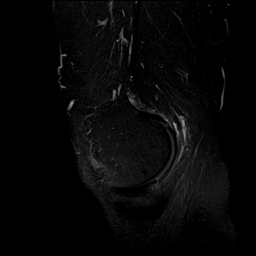
[im 11/27]
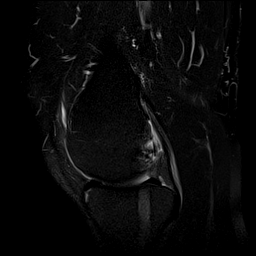
[im 16/27]
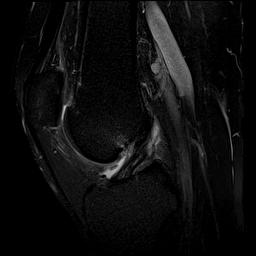
[im 21/27]
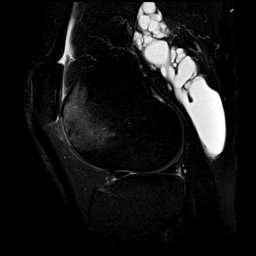
[im 27/27]
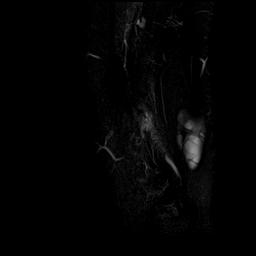

[40 of 40 positions shown; findings below may reference images not displayed]

FINDINGS: MENISCI

Medial meniscus: There is diffuse intermediate proton density signal
throughout the posterior horn of the medial meniscus with a more
defined intermediate signal linear oblique tear extending through
the inferior articular surface of the junction of the middle and
central thirds of the meniscal triangle (sagittal images 7-10). This
tear continues into the posterior segment of the body of the medial
meniscus where extends through the peripheral wall of the meniscal
triangle (coronal image 18), however no tear is seen extending
through an articular surface of the body of the medial meniscus.
There is increased linear signal suggesting an oblique tear
extending through the inferior articular surface of the middle third
of the meniscal triangle of the anterior horn of the medial meniscus
(sagittal images 8 through 11); there is motion artifact in this
region, and this is indeterminate for artifact versus a true oblique
undersurface tear.

Lateral meniscus:  Intact.

LIGAMENTS

Cruciates: The ACL and PCL are intact.

Collaterals: The medial collateral ligament is intact. The fibular
collateral ligament, biceps femoris tendon, iliotibial band, and
popliteus tendon are intact.

CARTILAGE

Patellofemoral: There is high-grade partial-thickness cartilage loss
within the superior, lateral patellar facet near the patellar apex
(axial image 9) with moderate focal subchondral cystic change within
the adjacent patellar apex portion. There is full-thickness
cartilage loss throughout the superolateral aspect of the lateral
trochlea measuring up to 3 mm in transverse dimension and 18 mm in
craniocaudal dimension with high-grade subchondral marrow edema.

Medial:  Intact.

Lateral: Mild surface irregularity of the posterior mid transverse
lateral tibial plateau cartilage with mild subchondral increased T2
signal.

Joint: Smalljoint effusion. Mild edema within the superolateral
aspect of Hoffa's fat pad.

Popliteal Fossa:  Trace fluid within a Baker's cyst.

Extensor Mechanism: Mild intermediate T2 signal distal quadriceps
insertional tendinosis.

Bones:  No acute fracture or dislocation.

Other: There is extensive decreased T1 and increased T2 signal
cystic change with numerous internal septations seen throughout the
posterolateral aspect of the knee, extending off of the superior
plane of view in the region of the posterolateral distal femoral
diaphysis (axial image 126), and coursing inferiorly along the
posterolateral aspect of the distal lateral femoral condyle. This
may represent a ganglion, measuring up to approximately 3.1 x 3.2 x
10 cm (transverse by AP by craniocaudal). There is a possible
"trail"/origin of this ganglion extending from the a region of the
origin of the lateral head of the gastrocnemius muscle/tendon from
the distal lateral femoral metadiaphysis (sagittal series 21 images
22 through 24, axial image 10). There also is multiloculated cystic
fluid extending anteriorly from this region along the lateral border
of the distal femoral condyle (sagittal series 21 images 24 through
26, axial series 16 images 9 through 13).
IMPRESSION: :
IMPRESSION: 1. There is extensive multiloculated cystic fluid along the
posterolateral aspect of the knee as described above. This may
represent complex ganglia possibly originating from the origin of
the lateral head of the gastrocnemius musculotendinous junction at
the distal posterolateral femoral metaphysis.
2. Degenerative change within the posterior horn of the medial
meniscus with a subtle oblique tear extending through the inferior
articular surface. Additional possible oblique undersurface tear
versus artifact within the anterior horn of the medial meniscus.
3. Full-thickness cartilage defect within the superior aspect of the
lateral trochlea measuring up to 18 mm in craniocaudal dimension
with high-grade subchondral marrow edema.

## 2023-07-07 ENCOUNTER — Ambulatory Visit (INDEPENDENT_AMBULATORY_CARE_PROVIDER_SITE_OTHER): Payer: 59

## 2023-07-07 DIAGNOSIS — J309 Allergic rhinitis, unspecified: Secondary | ICD-10-CM

## 2023-07-12 ENCOUNTER — Ambulatory Visit (INDEPENDENT_AMBULATORY_CARE_PROVIDER_SITE_OTHER): Payer: 59

## 2023-07-12 DIAGNOSIS — J309 Allergic rhinitis, unspecified: Secondary | ICD-10-CM | POA: Diagnosis not present

## 2023-07-26 ENCOUNTER — Ambulatory Visit (INDEPENDENT_AMBULATORY_CARE_PROVIDER_SITE_OTHER): Payer: 59

## 2023-07-26 DIAGNOSIS — J309 Allergic rhinitis, unspecified: Secondary | ICD-10-CM

## 2023-08-09 ENCOUNTER — Ambulatory Visit (INDEPENDENT_AMBULATORY_CARE_PROVIDER_SITE_OTHER): Payer: Self-pay

## 2023-08-09 DIAGNOSIS — J309 Allergic rhinitis, unspecified: Secondary | ICD-10-CM

## 2023-08-23 ENCOUNTER — Ambulatory Visit (INDEPENDENT_AMBULATORY_CARE_PROVIDER_SITE_OTHER): Payer: Self-pay

## 2023-08-23 DIAGNOSIS — J309 Allergic rhinitis, unspecified: Secondary | ICD-10-CM | POA: Diagnosis not present

## 2023-09-06 ENCOUNTER — Ambulatory Visit (INDEPENDENT_AMBULATORY_CARE_PROVIDER_SITE_OTHER): Payer: 59

## 2023-09-06 DIAGNOSIS — J309 Allergic rhinitis, unspecified: Secondary | ICD-10-CM

## 2023-09-11 DIAGNOSIS — J3089 Other allergic rhinitis: Secondary | ICD-10-CM | POA: Diagnosis not present

## 2023-09-11 NOTE — Progress Notes (Signed)
EXP 09/10/24

## 2023-09-20 ENCOUNTER — Ambulatory Visit (INDEPENDENT_AMBULATORY_CARE_PROVIDER_SITE_OTHER): Payer: Self-pay

## 2023-09-20 DIAGNOSIS — J309 Allergic rhinitis, unspecified: Secondary | ICD-10-CM

## 2023-10-04 ENCOUNTER — Ambulatory Visit (INDEPENDENT_AMBULATORY_CARE_PROVIDER_SITE_OTHER): Payer: 59 | Admitting: *Deleted

## 2023-10-04 DIAGNOSIS — J309 Allergic rhinitis, unspecified: Secondary | ICD-10-CM | POA: Diagnosis not present

## 2023-10-18 ENCOUNTER — Ambulatory Visit (INDEPENDENT_AMBULATORY_CARE_PROVIDER_SITE_OTHER): Payer: Self-pay

## 2023-10-18 DIAGNOSIS — J309 Allergic rhinitis, unspecified: Secondary | ICD-10-CM

## 2023-10-30 ENCOUNTER — Ambulatory Visit (INDEPENDENT_AMBULATORY_CARE_PROVIDER_SITE_OTHER): Payer: Self-pay

## 2023-10-30 DIAGNOSIS — J309 Allergic rhinitis, unspecified: Secondary | ICD-10-CM | POA: Diagnosis not present

## 2023-11-16 ENCOUNTER — Telehealth: Payer: Self-pay | Admitting: Allergy & Immunology

## 2023-11-16 NOTE — Telephone Encounter (Signed)
 Patient's new specialty pharmacy called stating that in order to refill his Dupixent  they needthe diagnoses code. The pharmacy states they need his most recent progress notes,any allergies that he has and they also need his med list. The pharmacy fax number is [phone].

## 2023-11-16 NOTE — Telephone Encounter (Addendum)
 Per Research Officer, [employer] - it is okay to fax office notes, allergies and medication list to the new Specialty pharmacy @ [phone].   Requested information has been faxed.  Called patient - DOB/NEED Updated DPR/Insurance card - LMOVM advising patient of information needed.  If/When patient call back - advise of above notation.

## 2023-11-17 ENCOUNTER — Ambulatory Visit (INDEPENDENT_AMBULATORY_CARE_PROVIDER_SITE_OTHER): Payer: 59

## 2023-11-17 DIAGNOSIS — J309 Allergic rhinitis, unspecified: Secondary | ICD-10-CM | POA: Diagnosis not present

## 2023-11-22 ENCOUNTER — Ambulatory Visit (INDEPENDENT_AMBULATORY_CARE_PROVIDER_SITE_OTHER): Payer: BC Managed Care – PPO

## 2023-11-22 DIAGNOSIS — J309 Allergic rhinitis, unspecified: Secondary | ICD-10-CM

## 2023-12-01 ENCOUNTER — Ambulatory Visit (INDEPENDENT_AMBULATORY_CARE_PROVIDER_SITE_OTHER): Payer: Self-pay

## 2023-12-01 DIAGNOSIS — J309 Allergic rhinitis, unspecified: Secondary | ICD-10-CM | POA: Diagnosis not present

## 2023-12-01 NOTE — Telephone Encounter (Signed)
PA started with new coverage

## 2023-12-27 ENCOUNTER — Ambulatory Visit (INDEPENDENT_AMBULATORY_CARE_PROVIDER_SITE_OTHER): Payer: Self-pay

## 2023-12-27 DIAGNOSIS — J309 Allergic rhinitis, unspecified: Secondary | ICD-10-CM | POA: Diagnosis not present

## 2024-01-02 DIAGNOSIS — J3089 Other allergic rhinitis: Secondary | ICD-10-CM | POA: Diagnosis not present

## 2024-01-02 NOTE — Progress Notes (Signed)
 VIAL ONE MADE 01-02-24. EXP 01-01-25

## 2024-01-05 NOTE — Progress Notes (Signed)
 VIAL 2 MADE 01-03-24. EXP 01-02-25

## 2024-01-08 MED ORDER — DUPIXENT 300 MG/2ML ~~LOC~~ SOSY: 300.0000 mg | PREFILLED_SYRINGE | SUBCUTANEOUS | 3 refills | Status: AC

## 2024-01-08 NOTE — Addendum Note (Signed)
 Addended by: Devoria Glassing on: 01/08/2024 02:26 PM   Modules accepted: Orders

## 2024-01-08 NOTE — Telephone Encounter (Signed)
 Called patient and l/m that Ins approved Dupixent but will need to change pharmacy to [facility] and Rx sent to same

## 2024-01-24 ENCOUNTER — Ambulatory Visit (INDEPENDENT_AMBULATORY_CARE_PROVIDER_SITE_OTHER): Payer: Self-pay

## 2024-01-24 DIAGNOSIS — J309 Allergic rhinitis, unspecified: Secondary | ICD-10-CM

## 2024-02-21 ENCOUNTER — Ambulatory Visit (INDEPENDENT_AMBULATORY_CARE_PROVIDER_SITE_OTHER): Payer: Self-pay

## 2024-02-21 DIAGNOSIS — J309 Allergic rhinitis, unspecified: Secondary | ICD-10-CM

## 2024-03-20 ENCOUNTER — Ambulatory Visit (INDEPENDENT_AMBULATORY_CARE_PROVIDER_SITE_OTHER)

## 2024-03-20 DIAGNOSIS — J309 Allergic rhinitis, unspecified: Secondary | ICD-10-CM | POA: Diagnosis not present

## 2024-03-29 ENCOUNTER — Ambulatory Visit (INDEPENDENT_AMBULATORY_CARE_PROVIDER_SITE_OTHER): Admitting: Allergy & Immunology

## 2024-03-29 ENCOUNTER — Encounter: Payer: Self-pay | Admitting: Allergy & Immunology

## 2024-03-29 VITALS — BP 122/78 | HR 61 | Temp 97.9°F | Ht 72.24 in | Wt 214.4 lb

## 2024-03-29 DIAGNOSIS — J302 Other seasonal allergic rhinitis: Secondary | ICD-10-CM

## 2024-03-29 DIAGNOSIS — J339 Nasal polyp, unspecified: Secondary | ICD-10-CM | POA: Diagnosis not present

## 2024-03-29 DIAGNOSIS — J3089 Other allergic rhinitis: Secondary | ICD-10-CM

## 2024-03-29 MED ORDER — PREDNISONE 10 MG PO TABS: ORAL_TABLET | ORAL | 0 refills | Status: AC

## 2024-03-29 MED ORDER — XHANCE 93 MCG/ACT NA EXHU: 1.0000 | INHALANT_SUSPENSION | Freq: Two times a day (BID) | NASAL | 3 refills | Status: AC

## 2024-03-29 MED ORDER — EPINEPHRINE 0.3 MG/0.3ML IJ SOAJ: 0.3000 mg | INTRAMUSCULAR | 1 refills | Status: AC | PRN

## 2024-03-29 NOTE — Patient Instructions (Addendum)
 1. Seasonal and perennial allergic rhinitis - with bilateral nasal polyps but much much better (I cannot see it on the left side and it improved on the right side) - Continue with Xhance  one spray per nostril twice as needed. - Continue with allergy  shots. - We will plan to continue through the end of 2027. - Continue with the Dupixent , but space out to every 3 week to see how you do.   2. Return in about 1 year (around 03/29/2025). You can have the follow up appointment with Dr. Idolina Maker or a Nurse Practicioner (our Nurse Practitioners are excellent and always have Physician oversight!).    Please inform us  of any Emergency Department visits, hospitalizations, or changes in symptoms. Call us  before going to the ED for breathing or allergy  symptoms since we might be able to fit you in for a sick visit. Feel free to contact us  anytime with any questions, problems, or concerns.  It was a pleasure to see you and your family again today!  Websites that have reliable patient information: 1. American Academy of Asthma, Allergy , and Immunology: www.aaaai.org 2. Food Allergy  Research and Education (FARE): foodallergy.org 3. Mothers of Asthmatics: http://www.asthmacommunitynetwork.org 4. American College of Allergy , Asthma, and Immunology: www.acaai.org      "Like" us  on Facebook and Instagram for our latest updates!      A healthy democracy works best when [employer] participate! Make sure you are registered to vote! If you have moved or changed any of your contact information, you will need to get this updated before voting! Scan the QR codes below to learn more!

## 2024-03-29 NOTE — Progress Notes (Signed)
 FOLLOW UP  Date of Service/Encounter:  03/29/24   Assessment:   Perennial and seasonal allergic rhinitis (grasses, trees, weeds, molds, dust mite, dogs, cat, cockroach) - with maintenance reached November 2022 (planning for shots through November 2027)   Bilateral nasal polyps - worse on the left with marked improvement on Xhance  plus Dupixent    Recurrent prednisone courses (6-7 per year for at least four years running, but no prednisone since Dupixent  and shots were started)  Plan/Recommendations:   1. Seasonal and perennial allergic rhinitis - with bilateral nasal polyps but much much better (I cannot see it on the left side and it improved on the right side) - Continue with Xhance  one spray per nostril twice as needed. - Continue with allergy  shots. - We will plan to continue through the end of 2027. - Continue with the Dupixent , but space out to every 3 week to see how you do.   2. Return in about 1 year (around 03/29/2025). You can have the follow up appointment with Dr. Idolina Maker or a Nurse Practicioner (our Nurse Practitioners are excellent and always have Physician oversight!).   Subjective:   Lucas Hill is a 58 y.o. male presenting today for follow up of  Chief Complaint  Patient presents with   Follow-up   Medication Refill    Lucas Hill has a history of the following: Patient Active Problem List   Diagnosis Date Noted   Other allergic rhinitis 11/24/2020   Allergic conjunctivitis of both eyes 11/24/2020    History obtained from: chart review and patient.  Discussed the use of AI scribe software for clinical note transcription with the patient and/or guardian, who gave verbal consent to proceed.  Lucas Hill is a 58 y.o. male presenting for a follow up visit.  He was last seen in May 2024.  At that time, we will continue with Xhance  1 spray per nostril twice daily as well as allergy  shots and Dupixent .  We did space out Dupixent  every 3 weeks.  Since the  last visit, he has done very very well.   Allergic Rhinitis Symptom History: He is currently receiving allergy  shots once a month and has been on this regimen for some time. He uses Dupixent , which has been effective, and he has a copay card to assist with the cost. He is not currently using Xhance  and has not needed to refill it recently. No recent use of prednisone or antibiotics, and no recent flu or pneumonia. He has not been on antibiotics for sinus infections and he has not received any prednisone for nasal polyp flares. He thinks that he did try spacing out to every 3 weeks with the Dupixent , but his allergic rhinitis and congestion seems to have worsened.    Lucas Hill is on allergen immunotherapy. He receives two injections. Immunotherapy script #1 contains trees, weeds, grasses, and dust mites. He currently receives 0.50mL of the RED vial (1/100). Immunotherapy script #2 contains molds, cat, dog, and cockroach. He currently receives 0.50mL of the RED vial (1/100). He started shots May of 2022 and reached maintenance in November of 2022.   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 He enjoys problem-solving and has no immediate plans for retirement. He does not drink or smoke and exercises regularly.  His family history includes a 62 year old mother-in-law who lives independently, and he has children and grandchildren. He is planning a trip to [place] for mission work, leaving next week.   Otherwise, there  have been no changes to his past medical history, surgical history, family history, or social history.    Review of systems otherwise negative other than that mentioned in the HPI.    Objective:   Blood pressure 122/78, pulse 61, temperature 97.9 F (36.6 C), height 6' 0.24" (1.835 m), weight 214 lb 6 oz (97.2 kg), SpO2 96%. Body mass index is 28.88 kg/m.    Physical Exam Vitals reviewed.  Constitutional:      Appearance: He is  well-developed.     Comments: Very friendly. Cooperative with the exam.   HENT:     Head: Normocephalic and atraumatic.     Right Ear: Tympanic membrane, ear canal and external ear normal.     Left Ear: Tympanic membrane, ear canal and external ear normal.     Nose: No nasal deformity, septal deviation, mucosal edema or rhinorrhea.     Right Turbinates: Enlarged, swollen and pale.     Left Turbinates: Enlarged, swollen and pale.     Right Sinus: No maxillary sinus tenderness or frontal sinus tenderness.     Left Sinus: No maxillary sinus tenderness or frontal sinus tenderness.     Comments: His polyps bilaterally have markedly improved.  The one on the right seems to be obstructing around 25% of the passage.  The one on the left has resolved to the point where it is almost difficult to see.     Mouth/Throat:     Lips: Pink.     Mouth: Mucous membranes are moist. Mucous membranes are not pale and not dry.     Pharynx: Uvula midline.  Eyes:     General: Lids are normal. No allergic shiner.       Right eye: No discharge.        Left eye: No discharge.     Conjunctiva/sclera: Conjunctivae normal.     Right eye: Right conjunctiva is not injected. No chemosis.    Left eye: Left conjunctiva is not injected. No chemosis.    Pupils: Pupils are equal, round, and reactive to light.  Cardiovascular:     Rate and Rhythm: Normal rate and regular rhythm.     Heart sounds: Normal heart sounds.  Pulmonary:     Effort: Pulmonary effort is normal. No tachypnea, accessory muscle usage or respiratory distress.     Breath sounds: Normal breath sounds. No wheezing, rhonchi or rales.     Comments: Moving air well in all lung fields. No increased work of breathing noted.  Chest:     Chest wall: No tenderness.  Lymphadenopathy:     Cervical: No cervical adenopathy.  Skin:    Coloration: Skin is not pale.     Findings: No abrasion, erythema, petechiae or rash. Rash is not papular, urticarial or vesicular.   Neurological:     Mental Status: He is alert.  Psychiatric:        Behavior: Behavior is cooperative.      Diagnostic studies: none     Drexel Gentles, MD  [facility] 

## 2024-04-05 ENCOUNTER — Telehealth: Payer: Self-pay

## 2024-04-05 ENCOUNTER — Ambulatory Visit: Payer: 59 | Admitting: Allergy & Immunology

## 2024-04-05 NOTE — Telephone Encounter (Signed)
*  Asthma/Allergy   Pharmacy Patient Advocate Encounter   Received notification from CoverMyMeds that prior authorization for Xhance  93MCG/ACT exhaler suspension  is required/requested.   Insurance verification completed.   The patient is insured through [employer] .   Per test claim: PA required; PA submitted to above mentioned insurance via CoverMyMeds Key/confirmation #/EOC [id number] Status is pending

## 2024-04-05 NOTE — Telephone Encounter (Signed)
 Approved today by [employer] 2017 PA Case: [id number], Status: Approved, Coverage Starts on: 04/05/2024 12:00:00 AM, Coverage Ends on: 04/05/2025 12:00:00 AM. Effective Date: 04/05/2024 Authorization Expiration Date: 04/05/2025

## 2024-05-17 ENCOUNTER — Ambulatory Visit (INDEPENDENT_AMBULATORY_CARE_PROVIDER_SITE_OTHER)

## 2024-05-17 DIAGNOSIS — J309 Allergic rhinitis, unspecified: Secondary | ICD-10-CM | POA: Diagnosis not present

## 2024-05-22 ENCOUNTER — Ambulatory Visit (INDEPENDENT_AMBULATORY_CARE_PROVIDER_SITE_OTHER): Payer: Self-pay

## 2024-05-22 DIAGNOSIS — J309 Allergic rhinitis, unspecified: Secondary | ICD-10-CM | POA: Diagnosis not present

## 2024-05-31 ENCOUNTER — Ambulatory Visit (INDEPENDENT_AMBULATORY_CARE_PROVIDER_SITE_OTHER): Payer: Self-pay

## 2024-05-31 DIAGNOSIS — J309 Allergic rhinitis, unspecified: Secondary | ICD-10-CM | POA: Diagnosis not present

## 2024-07-03 ENCOUNTER — Ambulatory Visit (INDEPENDENT_AMBULATORY_CARE_PROVIDER_SITE_OTHER)

## 2024-07-03 DIAGNOSIS — J309 Allergic rhinitis, unspecified: Secondary | ICD-10-CM | POA: Diagnosis not present

## 2024-07-31 ENCOUNTER — Ambulatory Visit (INDEPENDENT_AMBULATORY_CARE_PROVIDER_SITE_OTHER): Payer: Self-pay

## 2024-07-31 DIAGNOSIS — J309 Allergic rhinitis, unspecified: Secondary | ICD-10-CM

## 2024-08-07 ENCOUNTER — Ambulatory Visit (INDEPENDENT_AMBULATORY_CARE_PROVIDER_SITE_OTHER): Payer: Self-pay

## 2024-08-07 DIAGNOSIS — J309 Allergic rhinitis, unspecified: Secondary | ICD-10-CM | POA: Diagnosis not present

## 2024-08-16 ENCOUNTER — Ambulatory Visit (INDEPENDENT_AMBULATORY_CARE_PROVIDER_SITE_OTHER): Payer: Self-pay

## 2024-08-16 DIAGNOSIS — J309 Allergic rhinitis, unspecified: Secondary | ICD-10-CM

## 2024-09-11 ENCOUNTER — Ambulatory Visit: Payer: Self-pay

## 2024-09-11 DIAGNOSIS — J309 Allergic rhinitis, unspecified: Secondary | ICD-10-CM

## 2024-10-09 ENCOUNTER — Ambulatory Visit

## 2024-10-09 DIAGNOSIS — J309 Allergic rhinitis, unspecified: Secondary | ICD-10-CM | POA: Diagnosis not present

## 2024-11-04 DIAGNOSIS — J3081 Allergic rhinitis due to animal (cat) (dog) hair and dander: Secondary | ICD-10-CM | POA: Diagnosis not present

## 2024-11-04 DIAGNOSIS — J3089 Other allergic rhinitis: Secondary | ICD-10-CM | POA: Diagnosis not present

## 2024-11-04 DIAGNOSIS — J302 Other seasonal allergic rhinitis: Secondary | ICD-10-CM | POA: Diagnosis not present

## 2024-11-04 DIAGNOSIS — J301 Allergic rhinitis due to pollen: Secondary | ICD-10-CM | POA: Diagnosis not present

## 2024-11-04 NOTE — Progress Notes (Signed)
 VIALS MADE ON 11/04/24

## 2024-11-06 ENCOUNTER — Ambulatory Visit (INDEPENDENT_AMBULATORY_CARE_PROVIDER_SITE_OTHER)

## 2024-11-06 DIAGNOSIS — J309 Allergic rhinitis, unspecified: Secondary | ICD-10-CM | POA: Diagnosis not present

## 2024-12-04 ENCOUNTER — Ambulatory Visit

## 2024-12-04 DIAGNOSIS — J302 Other seasonal allergic rhinitis: Secondary | ICD-10-CM | POA: Diagnosis not present

## 2025-03-28 ENCOUNTER — Ambulatory Visit: Admitting: Allergy & Immunology
